# Patient Record
Sex: Female | Born: 1945 | Race: White | Hispanic: No | Smoking: Never smoker
Health system: Southern US, Community
[De-identification: ages and names within clinical notes are randomized; demographics above are authoritative.]

## PROBLEM LIST (undated history)

## (undated) DIAGNOSIS — R42 Dizziness and giddiness: Secondary | ICD-10-CM

## (undated) DIAGNOSIS — D688 Other specified coagulation defects: Secondary | ICD-10-CM

## (undated) DIAGNOSIS — E785 Hyperlipidemia, unspecified: Secondary | ICD-10-CM

## (undated) DIAGNOSIS — R000 Tachycardia, unspecified: Secondary | ICD-10-CM

## (undated) DIAGNOSIS — K589 Irritable bowel syndrome without diarrhea: Secondary | ICD-10-CM

## (undated) DIAGNOSIS — R002 Palpitations: Secondary | ICD-10-CM

## (undated) DIAGNOSIS — R7303 Prediabetes: Secondary | ICD-10-CM

## (undated) DIAGNOSIS — F32A Depression, unspecified: Secondary | ICD-10-CM

## (undated) DIAGNOSIS — K5792 Diverticulitis of intestine, part unspecified, without perforation or abscess without bleeding: Secondary | ICD-10-CM

## (undated) DIAGNOSIS — I4719 Other supraventricular tachycardia: Secondary | ICD-10-CM

## (undated) DIAGNOSIS — I471 Supraventricular tachycardia: Secondary | ICD-10-CM

## (undated) DIAGNOSIS — F329 Major depressive disorder, single episode, unspecified: Secondary | ICD-10-CM

## (undated) DIAGNOSIS — M1990 Unspecified osteoarthritis, unspecified site: Secondary | ICD-10-CM

## (undated) HISTORY — PX: TONSILLECTOMY: SUR1361

## (undated) HISTORY — DX: Supraventricular tachycardia: I47.1

## (undated) HISTORY — PX: CATARACT EXTRACTION: SUR2

## (undated) HISTORY — PX: ANTERIOR AND POSTERIOR REPAIR: SHX1172

## (undated) HISTORY — DX: Irritable bowel syndrome, unspecified: K58.9

## (undated) HISTORY — DX: Palpitations: R00.2

## (undated) HISTORY — DX: Dizziness and giddiness: R42

## (undated) HISTORY — DX: Tachycardia, unspecified: R00.0

## (undated) HISTORY — DX: Hyperlipidemia, unspecified: E78.5

## (undated) HISTORY — PX: TUBAL LIGATION: SHX77

## (undated) HISTORY — DX: Other supraventricular tachycardia: I47.19

---
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
## 1983-06-17 HISTORY — PX: TOTAL ABDOMINAL HYSTERECTOMY: SHX209

## 1998-12-12 ENCOUNTER — Other Ambulatory Visit: Admission: RE | Admit: 1998-12-12 | Discharge: 1998-12-12 | Payer: Self-pay | Admitting: Obstetrics & Gynecology

## 2000-01-09 ENCOUNTER — Other Ambulatory Visit: Admission: RE | Admit: 2000-01-09 | Discharge: 2000-01-09 | Payer: Self-pay | Admitting: Obstetrics & Gynecology

## 2000-12-29 ENCOUNTER — Other Ambulatory Visit: Admission: RE | Admit: 2000-12-29 | Discharge: 2000-12-29 | Payer: Self-pay | Admitting: Obstetrics & Gynecology

## 2001-09-24 ENCOUNTER — Inpatient Hospital Stay (HOSPITAL_COMMUNITY): Admission: RE | Admit: 2001-09-24 | Discharge: 2001-09-25 | Payer: Self-pay | Admitting: Obstetrics & Gynecology

## 2001-10-19 ENCOUNTER — Inpatient Hospital Stay (HOSPITAL_COMMUNITY): Admission: AD | Admit: 2001-10-19 | Discharge: 2001-10-19 | Payer: Self-pay | Admitting: Obstetrics and Gynecology

## 2002-02-02 ENCOUNTER — Other Ambulatory Visit: Admission: RE | Admit: 2002-02-02 | Discharge: 2002-02-02 | Payer: Self-pay | Admitting: Obstetrics & Gynecology

## 2003-02-09 ENCOUNTER — Other Ambulatory Visit: Admission: RE | Admit: 2003-02-09 | Discharge: 2003-02-09 | Payer: Self-pay | Admitting: Obstetrics & Gynecology

## 2004-02-21 ENCOUNTER — Other Ambulatory Visit: Admission: RE | Admit: 2004-02-21 | Discharge: 2004-02-21 | Payer: Self-pay | Admitting: Obstetrics & Gynecology

## 2004-06-16 HISTORY — PX: LAPAROSCOPIC CHOLECYSTECTOMY: SUR755

## 2005-03-26 ENCOUNTER — Other Ambulatory Visit: Admission: RE | Admit: 2005-03-26 | Discharge: 2005-03-26 | Payer: Self-pay | Admitting: Obstetrics & Gynecology

## 2005-04-16 ENCOUNTER — Encounter (INDEPENDENT_AMBULATORY_CARE_PROVIDER_SITE_OTHER): Payer: Self-pay | Admitting: Specialist

## 2005-04-16 ENCOUNTER — Inpatient Hospital Stay (HOSPITAL_COMMUNITY): Admission: RE | Admit: 2005-04-16 | Discharge: 2005-04-19 | Payer: Self-pay | Admitting: Surgery

## 2005-04-18 ENCOUNTER — Encounter (INDEPENDENT_AMBULATORY_CARE_PROVIDER_SITE_OTHER): Payer: Self-pay | Admitting: Specialist

## 2010-01-22 ENCOUNTER — Ambulatory Visit: Payer: Self-pay | Admitting: Cardiology

## 2010-01-30 ENCOUNTER — Ambulatory Visit (HOSPITAL_COMMUNITY): Admission: RE | Admit: 2010-01-30 | Discharge: 2010-01-30 | Payer: Self-pay | Admitting: Cardiology

## 2010-01-30 ENCOUNTER — Ambulatory Visit: Payer: Self-pay | Admitting: Cardiology

## 2010-06-20 ENCOUNTER — Ambulatory Visit: Payer: Self-pay | Admitting: Cardiology

## 2010-11-01 NOTE — Op Note (Signed)
[name], [name]                ACCOUNT NO.:  [id number]   MEDICAL RECORD NO.:  [id number]          PATIENT TYPE:  AMB   LOCATION:  DAY                          FACILITY:  [facility]   PHYSICIAN:  Thomas A. Cornett, M.D.DATE OF BIRTH:  September 22, 1945   DATE OF PROCEDURE:  04/16/2005  DATE OF DISCHARGE:                                 OPERATIVE REPORT   PREOPERATIVE DIAGNOSES:  Symptomatic cholelithiasis with elevation of liver  function studies.   POSTOPERATIVE DIAGNOSES:  1.  Symptomatic cholelithiasis with elevation of liver function studies.  2.  Roughly a 1 cm common duct stone.   PROCEDURE:  Laparoscopic cholecystectomy with intraoperative cholangiogram.   SURGEON:  Thomas A. Cornett, M.D.   ASSISTANT:  Sheppard Plumber. Earlene Plater, M.D.   ANESTHESIA:  General endotracheal anesthesia with 15 mL of 0.25% Sensorcaine  local.   ESTIMATED BLOOD LOSS:  20 mL.   DRAINS:  None.   SPECIMEN:  Gallbladder with sludge and debris.   INDICATIONS FOR PROCEDURE:  The patient is a 65 year old female whose had  intermittent right upper quadrant pain with nausea and vomiting. She was  noted to have sludge and what appeared to be some particulate gallstone  material in the gallbladder. Also her liver function were transiently  elevated. Given her presentation which was consistent symptomatic  cholelithiasis and elevation of LFTs, I recommend a laparoscopic  cholecystectomy with intraoperative cholangiogram for treatment. She  understood the risks of the procedure and agreed to proceed.   DESCRIPTION OF PROCEDURE:  The patient was brought to the operating suite  and placed supine. After induction of general endotracheal anesthesia, her  abdomen was prepped and draped in a sterile fashion. A 1 cm supraumbilical  incision was made and dissection was carried down to her fascia. There was  an umbilical hernia noted and I used this to enter her abdominal cavity.  Once I entered the abdominal cavity, a  pursestring suture of #0 Vicryl was  used around the defect. A 12-mm Hassan cannula was placed under direct  vision. Once the pursestring suture was placed, this was secured to the  cannula and CO2 was insufflated to 15 mmHg pressure of CO2. Laparoscopy was  then performed. There were some adhesions inferior to the incision where the  umbilical defect was with omentum but no evidence of any bowel up in this.  The remainder of her laparoscopy was within normal limits. Next, a 5 mm  subxiphoid port was placed under direct vision. Two of the 5 mm ports were  placed in the right upper quadrant under direct vision with local  anesthesia. The gallbladder dome was retracted toward the patient's right  shoulder and a second grasper was used to grab the gallbladder at the  infundibulum. This was retracted at the right lower quadrant. There were  some omental adhesions I gently pushed away and used electrocautery to  dissect away from the infundibulum of the gallbladder. I then dissected  around the cystic duct circumferentially. This was the only tubular  structure entering the gallbladder. I then placed clips on cystic duct side  of  the gallbladder and made a small ductotomy for intraoperative  cholangiogram. Three separate stab incision. Through a separate stab  incision, a Cook catheter was placed. The [facility] catheter was then placed into  the cystic duct and held in place with a clip. Intraoperative cholangiogram  revealed opacification of a long cystic duct entering the common duct. There  was contrast that went up to the bifurcation and down through the common  duct into the duodenum. There was a circular filling that despite numerous  images and putting the patient in both flat and then in Trendelenburg, I  could not get to move and did not appear to be an air bubble at this point.  My concern was for a small retained common duct stone. There was no evidence  of obstruction and there was free  flow of contrast into the duodenum. At  this point in time, I elected to complete the cholecystectomy and seek ERCP  postoperatively given the size of the stone and omental dilatation without  any obvious signs of obstruction. The cystic duct remnant was triple clipped  and divided. I then completed the division of the cystic duct at this point.  There is a small posterior cystic artery and I was able to dissect this out  and double clip and divide it. Cautery was used to dissect the gallbladder  from the gallbladder fossa but the gallbladder was entered with spillage of  bile and no obvious loss of large stones. There was some residual sludge in  this. Once I was able to get around the hole in the gallbladder, I dissected  the remainder of the gallbladder out of the gallbladder fossa. I then used a  5 mm scope and placed the gallbladder in an EndoCatch bag through the  umbilical port and extracted the specimen. There was significant ooze in the  liver bed and I had to use cautery and Surgicel to control this. The clips  were on the cystic duct and cystic artery with good hemostasis without  leakage of bile. At this point in time after using cautery to control the  majority of the hepatic bed bleeding, this irrigation was suctioned out.  There appeared to be good hemostasis to this point. The remainder of the  irrigation was suctioned out. As for her umbilical hernia, given the fact  that it was significantly large almost 2 to 2 1/2 cm and the fact that there  was spillage of bile during the procedure, I did not feel it would be safe  to repair this since this would probably require mesh and I felt this could  be done at a later time once her common duct stone has been addressed  especially in light of her getting instrumentation of her biliary tract. At  this point in time, I then went ahead and closed the umbilical port site  after removing all the ports and releasing the CO2 with the #0  Vicryl stitch. There is no evidence of any port site bleeding. I then closed all  skin incisions with 4-0 Monocryl. Steri-Strips and dry dressings were  applied. Sponge, needle and instrument counts were counted and found to be  correct at this portion of the case. The patient was awoke, taken to  recovery in satisfactory condition. Postoperatively, I will ask  gastroenterologist to see her for consideration of ERCP or sphincterotomy.      Thomas A. Cornett, M.D.  Electronically Signed     TAC/MEDQ  D:  04/16/2005  T:  04/16/2005  Job:  [id number]   cc:   Gabriel Earing, M.D.  Fax: [phone]   Langley Gauss, MD

## 2010-11-01 NOTE — Op Note (Signed)
[facility]  Patient:    CHERLY, ERNO Visit Number: [id number] MRN: [id number]          Service Type: DSU Location: 9300 [id number] Attending Physician:  Minette Headland Dictated by:   Freddy Finner, M.D. Proc. Date: 09/23/01 Admit Date:  09/23/2001                             Operative Report  PREOPERATIVE DIAGNOSES:       Symptomatic cystourethrocele, symptomatic rectocele.  POSTOPERATIVE DIAGNOSES:      Symptomatic cystourethrocele, symptomatic rectocele.  OPERATIVE PROCEDURE:          Anterior and posterior colporrhaphy.  SURGEON:                      Freddy Finner, M.D.  ASSISTANT:                    Marcelle Overlie, M.D.  ANESTHESIA:                   General.  ESTIMATED INTRAOPERATIVE BLOOD LOSS:                   100 cc.  INTRAOPERATIVE COMPLICATIONS: None.  Details of the present illness recorded in the admission note.  DESCRIPTION OF PROCEDURE:     The patient was admitted on the morning of surgery.  She was given a bolus of Cefotan IV.  She was placed in PAS hose. She was taken to the operating room, and there placed under adequate general endotracheal anesthesia, and placed in the dorsal lithotomy position using the [facility] stirrup system.  Betadine prep of the lower abdomen, perineum, and vagina was carried out in the usual fashion with scrub, followed by solution. Sterile drapes were applied.  Posterior weighted vaginal retractor was placed. Anterior vaginal mucosa was tented with an Allis and entered sharply.  An incision of approximately 5 cm length was created in the midline and with careful blunt and sharp dissection, the bladder and urethra were separated from the overlying vaginal mucosa and vesicovaginal fascia.  The vesicovaginal fascia was then plicated using a total of three interrupted 0 Monocryl sutures to elevate the urethrovesical angle and to reduce the cystocele.  The incision was then closed after  removing small segments of tissue with a running locking stitch of 2-0 Monocryl.  Posterior repair was then begun.  The posterior weighted retractor was removed.  The ______ was grasped on each side with Allis clamps, pyramidal shaped segment of skin was removed from the peroneal body with apex inferiorly.  The mucosa overlying the rectum was then developed with blunt and sharp dissection, and incision made in the posterior vaginal mucosa for a distance of approximately 5-7 cm.  The perirectal tissue was separated off of the mucosa.  Perirectal tissue was then plicated with 0 Monocryl sutures and levators were approximated to lengthen the vagina. Segments of the mucosa were then excised and then the posterior mucosa and perineal body closed with running 2-0 Monocryl.  The bladder was filled with irrigating solution, suprapubic banana catheter was placed without difficulty and anchored to the skin with nylon suture.  Two inch Iodoform gauze pack was placed.  The bladder was evacuated.  The patient was awakened and taken to recovery in good condition. Dictated by:   Freddy Finner, M.D. Attending Physician:  Minette Headland DD:  09/23/01 TD:  09/23/01 Job: [id number]

## 2010-11-01 NOTE — Consult Note (Signed)
[name], [name]                ACCOUNT NO.:  [id number]   MEDICAL RECORD NO.:  [id number]          PATIENT TYPE:  AMB   LOCATION:  DAY                          FACILITY:  [facility]   PHYSICIAN:  James L. Malon Kindle., M.D.DATE OF BIRTH:  20-Oct-1945   DATE OF CONSULTATION:  04/16/2005  DATE OF DISCHARGE:                                   CONSULTATION   REASON FOR CONSULTATION:  The patient just underwent lap-chole, has retained  common duct stone.   HISTORY OF PRESENT ILLNESS:  This is a 65 year old woman who is a patient of  Dr. Fayrene Fearing Goreni's down in [facility].  She was found to have gallstones and  underwent a lap-chole today with the finding of a common duct stone.   OUTPATIENT MEDICATIONS:  1.  Ogen.  2.  Paxil.  3.  Allegra.  4.  Calcium.  5.  Vitamins.   ALLERGIES:  CODEINE, DEMERAL, CAUSE HER TO BE NAUSEATED.   PAST MEDICAL HISTORY:  1.  History of spastic colon.  2.  Hepatitis in 1989.  Hepatitis B and C serology is negative at this time.   PAST SURGICAL HISTORY:  1.  Tonsillectomy.  2.  Hysterectomy with A&P repair.  3.  Cystocele and rectocele repair.  4.  Cataract surgery.   SOCIAL HISTORY:  She is married, lives with her husband.   PHYSICAL EXAMINATION:  GENERAL APPEARANCE:  The patient is in the recovery  room and is quite sedated.  Nonicteric.  Alert and responsive but somnolent.  VITAL SIGNS:  Normal.  LUNGS:  Clear.  HEART:  Regular rate and rhythm without murmurs or gallops.  ABDOMEN:  Silent following her lap-chole.   ASSESSMENT:  Probable common duct stone.  Agree the ERCP and the  sphincterotomy would be appropriate at this time.   PLAN:  The patient was scheduled for an ERCP and probable sphincterotomy  tomorrow at approximately 11 o'clock by Dr. Danise Edge.  I have  discussed this procedure including the risks and benefits of the procedure  in detail with the patient's husband and daughter.           ______________________________  Llana Aliment. Malon Kindle., M.D.     Waldron Session  D:  04/16/2005  T:  04/16/2005  Job:  [id number]   cc:   Danise Edge, M.D.  Fax: [phone]   Leone Brand, M.D.  [facility]   Gabriel Earing, M.D.  Fax: [phone]

## 2010-11-01 NOTE — Discharge Summary (Signed)
[name], [name]                ACCOUNT NO.:  [id number]   MEDICAL RECORD NO.:  [id number]          PATIENT TYPE:  INP   LOCATION:  1613                         FACILITY:  [facility]   PHYSICIAN:  Maisie Fus A. Cornett, M.D.DATE OF BIRTH:  [birth year]   DATE OF ADMISSION:  04/16/2005  DATE OF DISCHARGE:  04/19/2005                                 DISCHARGE SUMMARY   ADMITTING DIAGNOSIS:  Cholelithiasis with elevated liver function studies.   DISCHARGE DIAGNOSIS:  Cholelithiasis and probable common bile duct stone.   PROCEDURE:  1.  Laparoscopic cholecystectomy and intraoperative cholangiogram.  2.  ERCP x2 with sphincterotomy.   BRIEF HISTORY:  The patient is a 65 year old female found to have  cholelithiasis, elevated liver function studies and dilated common duct. She  was admitted to the hospital for laparoscopic cholecystectomy which was done  April 16, 2005.   POSTOPERATIVE COURSE:  The patient's postop course was complicated by the  findings of a filling defect on her intraoperative cholangiogram. GI was  consulted and initial ERCP was done, but they were unable to cannulate the  common duct while reading the report. A second ERCP was then performed. They  were able to cannulate her common duct and felt they were able to slide a  balloon through the common duct and then drag out some debris which was seen  in the common duct. Sphincterotomy was also performed. This was done on  postop day #1 and postop day #2. By postoperative day #3 which was April 19, 2005 she was tolerating her diet, doing well and was discharged home with  normal liver function studies. She had no abdominal pain postoperatively and  had no ill effects from any of her procedures. She was discharged home on a  regular diet.   DISCHARGE INSTRUCTIONS:  The patient was discharged home on Percocet for  pain. She resumed her preoperative medications as before.   CONDITION ON DISCHARGE:  Satisfactory.   FOLLOW UP:  Will be in 2-3 weeks.      Thomas A. Cornett, M.D.  Electronically Signed     TAC/MEDQ  D:  05/05/2005  T:  05/06/2005  Job:  [id number]

## 2010-11-01 NOTE — Discharge Summary (Signed)
[facility]  Patient:    [name], [name] Visit Number: [id number] MRN: [id number]          Service Type: DSU Location: 9300 [id number] Attending Physician:  Minette Headland Dictated by:   Freddy Finner, M.D. Admit Date:  09/23/2001 Disc. Date: 09/25/01                             Discharge Summary  DISCHARGE DIAGNOSES:          1. Cystourethrocele.                               2. Stress incontinence.                               3. Symptomatic rectocele.  PROCEDURES:                   Anterior and posterior colporrhaphy.  COMPLICATIONS:                None.  DISPOSITION:                  The patient was in satisfactory and good condition at the time of her discharge.  At the present time, she is voiding small amounts with fairly small residuals.  She was given instructions to call when residual is 50 cc or less and voiding volumes are 200 cc or more.  She was given Darvocet to be taken for pain.  She is take a regular diet.  She is to use fiber supplements and stool softeners.  She is to take an over-the-counter vitamin.  Details of Present Illness, Past History, Family History, Review of Systems, and Physical Examination are recorded in the admission note.  The patients physical findings on admission were remarkable for cystocele and rectocele.  LABORATORY DATA:              On admission, normal CBC with hemoglobin 13.5; postoperative hemoglobin was 11.4.  Admission pro thrombin time, PTT, and INR were normal  HOSPITAL COURSE:              The patient was admitted on the morning of surgery.  She was placed in PAS hose.  She was given a bolus of Cefotan IV. She was taken to the operating room where the above described procedure was accomplished.  There were no intraoperative or postoperative complications. By the morning of the second postoperative day, she was in satisfactory condition.  She was ambulating without difficulty and  tolerating a regular diet.  She was having adequate bowel and bladder function and was discharged home with disposition as noted above.Dictated by:   Freddy Finner, M.D.  Attending Physician:  Minette Headland DD:  09/25/01 TD:  09/26/01 Job: [id number]

## 2010-11-01 NOTE — Op Note (Signed)
[name], [name]                ACCOUNT NO.:  [id number]   MEDICAL RECORD NO.:  [id number]          PATIENT TYPE:  INP   LOCATION:  1613                         FACILITY:  [facility]   PHYSICIAN:  Petra Kuba, M.D.    DATE OF BIRTH:  03-18-1946   DATE OF PROCEDURE:  04/18/2005  DATE OF DISCHARGE:                                 OPERATIVE REPORT   PROCEDURE:  Endoscopic retrograde cholangiopancreatography, sphincterotomy,  balloon pull-through and biopsy.   ENDOSCOPIST:  Petra Kuba, M.D.   INDICATIONS:  CBD stone on an intraoperative cholangiogram.  Consent was  signed after risks, benefits, methods, options were thoroughly discussed by  both Dr. Randa Evens, Laural Benes and myself over the last two days prior to having  premedication given.   MEDICATIONS USED:  Fentanyl 150 mcg, Versed 15 mg, glucagon 0.5.   DESCRIPTION OF PROCEDURE:  The side-viewing therapeutic video duodenoscope  was inserted by indirect vision into the stomach and advanced into the  duodenum.  This was slightly bulbous but essentially normal-appearing.  The  ampulla was brought into view.  One or two  folds below the ampulla. The  small possible polyp was seen.  It did not have any worrisome stigmata.  Initially, using the triple-lumen sphincterotome loaded with the 0.035 Jag  wire, we were able to get PD cannulation.  Once we realized we injected the  PD, we stopped the injection.  We then repositioned the sphincterotome on  multiple occasions and were unsuccessful at obtaining any CBD injections.  We did get unfortunately two submucosal injections.  We went ahead and  changed the regular sphincterotome for the tapered smaller sphincterotome  loaded with the 0.25 wire and unfortunately again obtained a few  pancreatograms.  We did not overfill the pancreas.  We did probe with the  wire and did insert the wire one time into the PD.  Once we did that, we  held the wire in place, removed the sphincterotome and went  ahead and tried  to cannulate using the regular sphincterotome and probing with the larger  wire and again were unsuccessful, gaining access.  We did move the patient  at this juncture to her left side.  The PD wire was removed and using the  smaller sphincterotome we were able to cannulate with the 0.825 wire.  We  went ahead and removed the smaller sphincterotome keeping the wire in the  proper position and then exchanged it for the larger sphincterotome.  A CBD  injection was then confirmed.  No stone was seen at this juncture.  We then  went ahead and proceeded with a medium size sphincterotomy until we had  adequate biliary drainage, and we can get the half bowed sphincterotome in  and out of the duct.  Based on the submucosal injection, we elected not to  cut any further since the landmarks were obscured.  We then exchanged for a  balloon and after one balloon pull-through which did not reveal any stone,  we proceeded with an occlusion cholangiogram, but there seemed to be a stone  just above where  we started the balloon pull-through so we went ahead and  dropped the balloon and inserted it further and then proceeded with two more  pull through without any stones being delivered.  There was only minimal  resistance in passing the 0.5 balloon through the sphincterotomy site  although we did pop one balloon on the first pull-through.  We then  proceeded with an occlusion cholangiogram at this juncture, which was normal  and on delayed views no stone was seen.  Excellent biliary drainage was seen  at this juncture.  We elected to stop this part of the procedure at this  time.  We went ahead and tried to biopsy the polyp because of the submucosal  injection, which did track distally.  The landmarks were distorted, but we  think we biopsied the polypoid area but could not be sure but did not see  any other area in question.  The scope was removed.  The patient tolerated  the procedure  adequately.  There was no obvious immediate complication.   ENDOSCOPIC DIAGNOSES:  1.  Normal or slightly bulbous ampulla.  2.  Submucosal injection.  3.  Normal pancreatic duct on a few injections  4.  I left the 0.25 wire in the pancreatic duct for a short time.  5.  Able to cannulate with the patient on her left side using the tapered      sphincterotome and the 0.825 wire.  6.  No stone seen initially but after one balloon pull-through possibly one      stone in the proximal duct, not delivered on two more balloon pull      through.  7.  Status post medium sphincterotomy.  8.  Three balloon pull through without stone being delivered.  9.  Negative occlusion cholangiogram.  10. Small duodenal polyp, questionably biopsied.   HPL  1.  Observe for delayed complications; if none but signs of retained CBD      stones happy to proceed with repeat ERCP which should be much easier now      that she has had a sphincterotomy.  2.  Also pending biopsy results; may need repeat EGD and rebiopsy based on      questionable adequate biopsies from the submucosal injection.  3.  One of my partners, I believe Dr. Evette Cristal, will round on her tomorrow.  4.  Will repeat labs and call her sooner p.r.n.           ______________________________  Petra Kuba, M.D.     MEM/MEDQ  D:  04/18/2005  T:  04/18/2005  Job:  [id number]   cc:   Petra Kuba, M.D.  Fax: [phone]   Gabriel Earing, M.D.  Fax: [phone]   Danise Edge, M.D.  Fax: [phone]   Clovis Pu. Cornett, M.D.  [address] [place] [address]  [place] [address]

## 2010-11-01 NOTE — Op Note (Signed)
[name], [name]                ACCOUNT NO.:  [id number]   MEDICAL RECORD NO.:  [id number]          PATIENT TYPE:  INP   LOCATION:  1613                         FACILITY:  [facility]   PHYSICIAN:  Danise Edge, M.D.   DATE OF BIRTH:  June 11, 1946   DATE OF PROCEDURE:  04/17/2005  DATE OF DISCHARGE:                                 OPERATIVE REPORT   PROCEDURE:  Unsuccessful common bile duct cannulation at ERCP.   PROCEDURE INDICATIONS:  [name] is a 65 year old female born 1946/04/25. [name] has undergone a laparoscopic cholecystectomy. Her  operative cholangiogram reveals a small common bile duct stone.   ENDOSCOPIST:  Danise Edge, M.D.   PREMEDICATION:  Rocephin 1 gram, glucagon 1 mg, fentanyl 75 mcg, Versed 7.5  mg.   DESCRIPTION OF PROCEDURE:  After obtaining informed consent, [name] was  placed in the prone position on the fluoroscopy table. I administered  intravenous fentanyl, intravenous Versed and intravenous glucagon. The  patient's blood pressure, oxygen saturation and cardiac rhythm were  monitored throughout the procedure and documented in the medical record.   The Olympus therapeutic duodenoscope was passed through the posterior  hypopharynx down the esophagus and into the proximal stomach without  difficulty. The pylorus was intubated and the endoscope advanced to the  second portion of the duodenum.   MAJOR PAPILLA:  The major papilla appears quite prominent but endoscopically  normal. Straw colored bile could be seen draining from the major papilla.   PANCREATOGRAM:  The distal pancreatic duct was cannulated. The distal  pancreatic duct was filled with contrast and appeared normal.   Attempted common bile duct cannulation using the wire loaded sphincterotome  was unsuccessful   A 3 mm polypoid type lesion was present just distal to the major papilla and  may actually represent an adenoma. I did not biopsy this lesion but  this area will  need to be biopsied with her repeat ERCP attempt.   ASSESSMENT:  1.  Unsuccessful common bile duct cannulation at ERCP.  2.  Normal distal pancreatic duct by pancreatogram.  3.  Small mucosal polypoid lesion just inferior to the major papilla.   RECOMMENDATIONS:  1.  Repeat ERCP attempt tomorrow by another endoscopist.  2.  Clear liquid diet today.  3.  Observe for signs of pancreatitis.  4.  Continue intravenous antibiotics.  5.  Intravenous saline at 200 mL per hour x 3000 mL and then reduce rate to      75 mL per hour.           ______________________________  Danise Edge, M.D.     MJ/MEDQ  D:  04/17/2005  T:  04/17/2005  Job:  [id number]

## 2010-11-01 NOTE — H&P (Signed)
[facility]  Patient:    [name], [name] Visit Number: [id number] MRN: [id number]          Service Type: DSU Location: [id number] Attending Physician:  Minette Headland Dictated by:   Freddy Finner, M.D. Admit Date:  09/23/2001                           History and Physical  ADMITTING DIAGNOSIS:          Symptomatic rectocele, symptomatic cystocele.  HISTORY OF PRESENT ILLNESS:   Patient is a 65 year old white married female, gravida 2, para 2, who presented approximately six months ago complaining of symptomatic rectocele.  She stated that this created some pressure and difficulty with bowel evacuation ad she was considering surgical repair.  She returned to the office again this year and requested surgical intervention. Her only complaint in January was of a rectocele.  But, on return to the office as part of her preoperative visit, she complained of pure stress incontinence.  On her examination, she does have a first-degree cystourethrocele.  Based on this finding, she is now admitted for anterior and posterior colporrhaphy.  PAST MEDICAL HISTORY:         Patient has no known significant medical illnesses.  ALLERGIES:                    She is allergic to CODEINE and DEMEROL.  CURRENT MEDICATIONS:          1. Ortho-Est 1.25 mg a day.                               2. Allegra 60 mg a day.                               3. Paxil 20 mg a day.  PREVIOUS SURGICAL PROCEDURES:                   Hysterectomy in 1985, dilatation and curettage in 1984, T&A at the age of 19, postpartum tubal after delivery in August 1980. She has had two vaginal deliveries.  She has never had a blood transfusion. She does not use cigarettes.  FAMILY HISTORY:               Noncontributory.  PHYSICAL EXAMINATION:  HEENT:                        Grossly within normal limits.  VITAL SIGNS:                  Blood pressure in the office is 136/74.  NECK:                          Thyroid gland is not palpably enlarged.  CHEST:                        Clear to auscultation.  HEART:                        Normal sinus rhythm without murmurs, rubs, or gallops.  ABDOMEN:  Soft and nontender without appreciable organomegaly or palpable masses.  The patient is moderately obese.  PELVIC:                       Exam is remarkable only for a first-degree cystourethrocele and for a second-degree rectocele.  Bimanual reveals no palpable masses.  The rectum is palpably normal.  EXTREMITIES:                  Without clubbing, cyanosis, or edema.  ASSESSMENT:                   Symptomatic pelvic relaxation with first-degree cystocele and second-degree rectocele.  PLAN:                         Anterior posterior colporrhaphy. Dictated by:   Freddy Finner, M.D. Attending Physician:  Minette Headland DD:  09/22/01 TD:  09/22/01 Job: [phone]

## 2011-11-28 ENCOUNTER — Encounter: Payer: Self-pay | Admitting: *Deleted

## 2012-10-08 ENCOUNTER — Other Ambulatory Visit: Payer: Self-pay | Admitting: Gastroenterology

## 2013-12-08 ENCOUNTER — Other Ambulatory Visit: Payer: Self-pay | Admitting: Dermatology

## 2016-03-08 ENCOUNTER — Emergency Department (HOSPITAL_BASED_OUTPATIENT_CLINIC_OR_DEPARTMENT_OTHER): Payer: Medicare Other

## 2016-03-08 ENCOUNTER — Observation Stay (HOSPITAL_BASED_OUTPATIENT_CLINIC_OR_DEPARTMENT_OTHER)
Admission: EM | Admit: 2016-03-08 | Discharge: 2016-03-09 | Disposition: A | Discharge status: Home / Self Care | Payer: Medicare Other | Attending: Family Medicine | Admitting: Family Medicine

## 2016-03-08 ENCOUNTER — Encounter (HOSPITAL_BASED_OUTPATIENT_CLINIC_OR_DEPARTMENT_OTHER): Payer: Self-pay | Admitting: Emergency Medicine

## 2016-03-08 DIAGNOSIS — K589 Irritable bowel syndrome without diarrhea: Secondary | ICD-10-CM | POA: Diagnosis not present

## 2016-03-08 DIAGNOSIS — R12 Heartburn: Secondary | ICD-10-CM | POA: Diagnosis not present

## 2016-03-08 DIAGNOSIS — E785 Hyperlipidemia, unspecified: Secondary | ICD-10-CM | POA: Insufficient documentation

## 2016-03-08 DIAGNOSIS — K5732 Diverticulitis of large intestine without perforation or abscess without bleeding: Secondary | ICD-10-CM | POA: Diagnosis present

## 2016-03-08 DIAGNOSIS — R072 Precordial pain: Principal | ICD-10-CM | POA: Insufficient documentation

## 2016-03-08 DIAGNOSIS — R079 Chest pain, unspecified: Secondary | ICD-10-CM

## 2016-03-08 DIAGNOSIS — R1013 Epigastric pain: Secondary | ICD-10-CM | POA: Diagnosis present

## 2016-03-08 DIAGNOSIS — R21 Rash and other nonspecific skin eruption: Secondary | ICD-10-CM | POA: Insufficient documentation

## 2016-03-08 HISTORY — DX: Diverticulitis of intestine, part unspecified, without perforation or abscess without bleeding: K57.92

## 2016-03-08 LAB — CBC WITH DIFFERENTIAL/PLATELET
BASOS ABS: 0 10*3/uL (ref 0.0–0.1)
Basophils Relative: 0 %
Eosinophils Absolute: 0.2 10*3/uL (ref 0.0–0.7)
Eosinophils Relative: 3 %
HCT: 42.9 % (ref 36.0–46.0)
Hemoglobin: 14.1 g/dL (ref 12.0–15.0)
Lymphocytes Relative: 22 %
Lymphs Abs: 1.9 10*3/uL (ref 0.7–4.0)
MCH: 26.9 pg (ref 26.0–34.0)
MCHC: 32.9 g/dL (ref 30.0–36.0)
MCV: 81.9 fL (ref 78.0–100.0)
MONO ABS: 0.7 10*3/uL (ref 0.1–1.0)
Monocytes Relative: 8 %
Neutro Abs: 5.7 10*3/uL (ref 1.7–7.7)
Neutrophils Relative %: 67 %
Platelets: 277 10*3/uL (ref 150–400)
RBC: 5.24 MIL/uL — ABNORMAL HIGH (ref 3.87–5.11)
RDW: 13.8 % (ref 11.5–15.5)
WBC: 8.5 10*3/uL (ref 4.0–10.5)

## 2016-03-08 LAB — COMPREHENSIVE METABOLIC PANEL
ALBUMIN: 4.2 g/dL (ref 3.5–5.0)
ALK PHOS: 62 U/L (ref 38–126)
ALT: 37 U/L (ref 14–54)
AST: 48 U/L — AB (ref 15–41)
Anion gap: 7 (ref 5–15)
BILIRUBIN TOTAL: 0.6 mg/dL (ref 0.3–1.2)
BUN: 12 mg/dL (ref 6–20)
CO2: 27 mmol/L (ref 22–32)
Calcium: 9.4 mg/dL (ref 8.9–10.3)
Chloride: 106 mmol/L (ref 101–111)
Creatinine, Ser: 0.76 mg/dL (ref 0.44–1.00)
GFR calc Af Amer: >60 mL/min (ref >60)
GFR calc non Af Amer: >60 mL/min (ref >60)
GLUCOSE: 120 mg/dL — AB (ref 65–99)
POTASSIUM: 3.6 mmol/L (ref 3.5–5.1)
Sodium: 140 mmol/L (ref 135–145)
TOTAL PROTEIN: 7.3 g/dL (ref 6.5–8.1)

## 2016-03-08 LAB — TROPONIN I
Troponin I: <0.03 ng/mL (ref <0.03)
Troponin I: <0.03 ng/mL (ref <0.03)

## 2016-03-08 LAB — LIPASE, BLOOD: Lipase: 30 U/L (ref 11–51)

## 2016-03-08 LAB — URINALYSIS, ROUTINE W REFLEX MICROSCOPIC
Bilirubin Urine: NEGATIVE
Glucose, UA: NEGATIVE mg/dL
HGB URINE DIPSTICK: NEGATIVE
Ketones, ur: NEGATIVE mg/dL
LEUKOCYTES UA: NEGATIVE
Nitrite: NEGATIVE
Protein, ur: NEGATIVE mg/dL
Specific Gravity, Urine: 1.007 (ref 1.005–1.030)
pH: 6 (ref 5.0–8.0)

## 2016-03-08 MED ORDER — ONDANSETRON HCL 4 MG/2ML IJ SOLN: 4.0000 mg | Freq: Four times a day (QID) | INTRAMUSCULAR | Status: DC | PRN

## 2016-03-08 MED ORDER — CALCIUM 600 + MINERALS 600-200 MG-UNIT PO TABS: 1.0000 | ORAL_TABLET | Freq: Two times a day (BID) | ORAL | Status: DC

## 2016-03-08 MED ORDER — VITAMIN D 1000 UNITS PO TABS: 2000.0000 [IU] | ORAL_TABLET | Freq: Every day | ORAL | Status: DC

## 2016-03-08 MED ORDER — GI COCKTAIL ~~LOC~~: 30.0000 mL | Freq: Four times a day (QID) | ORAL | Status: DC | PRN

## 2016-03-08 MED ORDER — CALCIUM CARBONATE-VITAMIN D 500-200 MG-UNIT PO TABS: 1.0000 | ORAL_TABLET | Freq: Two times a day (BID) | ORAL | Status: DC

## 2016-03-08 MED ORDER — PRAVASTATIN SODIUM 40 MG PO TABS: 40.0000 mg | ORAL_TABLET | Freq: Every day | ORAL | Status: DC

## 2016-03-08 MED ORDER — ACETAMINOPHEN 325 MG PO TABS: 650.0000 mg | ORAL_TABLET | ORAL | Status: DC | PRN

## 2016-03-08 MED ORDER — FAMOTIDINE 20 MG PO TABS
20.0000 mg | ORAL_TABLET | Freq: Once | ORAL | Status: AC
  Administered 2016-03-08: 20 mg via ORAL
  Filled 2016-03-08: qty 1

## 2016-03-08 MED ORDER — ASPIRIN EC 81 MG PO TBEC
81.0000 mg | DELAYED_RELEASE_TABLET | Freq: Every day | ORAL | Status: DC
  Administered 2016-03-08: 81 mg via ORAL
  Filled 2016-03-08: qty 1

## 2016-03-08 MED ORDER — MAGNESIUM OXIDE 400 (241.3 MG) MG PO TABS: 200.0000 mg | ORAL_TABLET | Freq: Every day | ORAL | Status: DC

## 2016-03-08 MED ORDER — DIPHENHYDRAMINE HCL 25 MG PO CAPS
25.0000 mg | ORAL_CAPSULE | Freq: Once | ORAL | Status: AC
  Administered 2016-03-08: 25 mg via ORAL
  Filled 2016-03-08: qty 1

## 2016-03-08 MED ORDER — ENOXAPARIN SODIUM 40 MG/0.4ML ~~LOC~~ SOLN
40.0000 mg | SUBCUTANEOUS | Status: DC
  Administered 2016-03-08: 40 mg via SUBCUTANEOUS
  Filled 2016-03-08: qty 0.4

## 2016-03-08 MED ORDER — PAROXETINE HCL 20 MG PO TABS
20.0000 mg | ORAL_TABLET | Freq: Every day | ORAL | Status: DC
Start: 2016-03-09 — End: 2016-03-09
  Administered 2016-03-09: 20 mg via ORAL
  Filled 2016-03-08: qty 1

## 2016-03-08 MED ORDER — VITAMIN C 500 MG PO TABS: 500.0000 mg | ORAL_TABLET | Freq: Every day | ORAL | Status: DC

## 2016-03-08 MED ORDER — MAGNESIUM CITRATE 100 MG PO TABS: 100.0000 mg | ORAL_TABLET | Freq: Every day | ORAL | Status: DC

## 2016-03-08 MED ORDER — BIOTIN 5 MG PO CAPS: 5.0000 mg | ORAL_CAPSULE | Freq: Every day | ORAL | Status: DC

## 2016-03-08 MED ORDER — GI COCKTAIL ~~LOC~~
30.0000 mL | Freq: Once | ORAL | Status: AC
  Administered 2016-03-08: 30 mL via ORAL
  Filled 2016-03-08: qty 30

## 2016-03-08 MED ORDER — HYDROXYZINE HCL 10 MG PO TABS
10.0000 mg | ORAL_TABLET | ORAL | Status: DC | PRN
  Administered 2016-03-08: 10 mg via ORAL
  Filled 2016-03-08: qty 1

## 2016-03-08 NOTE — ED Provider Notes (Signed)
[facility] Provider Note   CSN: [id number] Arrival date & time: 03/08/16  4098     History   Chief Complaint Chief Complaint  Patient presents with  . Heartburn    HPI [name] is a 70 y.o. female.  HPI [name] is a 70 y.o. female with PMH significant for IBS, diverticulitis, palpitations who presents with intermittent, last less than 10 minutes per episode, mild-moderate epigastric and lower sternal pain that occasionally radiates to right jaw over the past week that became worse this morning at 6 AM.  She states she has been taking Cipro and Flagyl for diverticulitis flare over the past week when her symptoms started.  She states she took her pill at 10:30 PM last night and then woke up with the indigestion.  She has not taken anything for her symptoms.  Not associated with eating.  She describes it as burning.  Denies pain now.  No aggravating factors.  Denies CP, SOB, fever, vomiting, bloody stools.  She felt nauseous earlier, but this resolved.  This has never happened before.  No cardiac history.  She states she had a stress test 10 years ago that was normal.  No family history CAD <65. Secondarily, she complains of resolving pruritic rash she noticed this morning in her groin and under arms.  She said they appeared like whelps, but they have since gone away, aside from her right underarm.  She did not take anything for her symptoms.  Denies facial, lip, or throat swelling.  Denies any new soaps, detergents, lotions.  She has taken Cipro and Flagyl in the past without problems.   Past Medical History:  Diagnosis Date  . Diverticulitis   . IBS (irritable bowel syndrome)   . Other and unspecified hyperlipidemia   . Palpitations   . Vertigo     Patient Active Problem List   Diagnosis Date Noted  . Chest pain 03/08/2016    Past Surgical History:  Procedure Laterality Date  . CATARACT EXTRACTION  1998, 2003   bilateral  . LAPAROSCOPIC CHOLECYSTECTOMY   2006  . TOTAL ABDOMINAL HYSTERECTOMY  1985    OB History    No data available       Home Medications    Prior to Admission medications   Medication Sig Start Date End Date Taking? Authorizing Provider  aspirin EC 81 MG tablet Take 81 mg by mouth daily.   Yes Historical Provider, MD  Biotin (BIOTIN 5000) 5 MG CAPS Take 5 mg by mouth daily.   Yes Historical Provider, MD  Calcium Carbonate-Vit D-Min (CALCIUM 600 + MINERALS) 600-200 MG-UNIT TABS Take 1 tablet by mouth 2 (two) times daily.   Yes Historical Provider, MD  cholecalciferol (VITAMIN D) 1000 units tablet Take 2,000 Units by mouth daily.   Yes Historical Provider, MD  Magnesium Citrate 100 MG TABS Take 100 mg by mouth daily.   Yes Historical Provider, MD  magnesium citrate SOLN Take 1 Bottle by mouth once.   Yes Historical Provider, MD  omega-3 acid ethyl esters (LOVAZA) 1 g capsule Take by mouth 2 (two) times daily.   Yes Historical Provider, MD  vitamin C (ASCORBIC ACID) 500 MG tablet Take 500 mg by mouth daily.   Yes Historical Provider, MD  estradiol (VIVELLE-DOT) 0.025 MG/24HR Place 1 patch onto the skin 2 (two) times a week.    Historical Provider, MD  PARoxetine (PAXIL) 20 MG tablet Take 20 mg by mouth every morning.  Historical Provider, MD  pravastatin (PRAVACHOL) 40 MG tablet Take 40 mg by mouth daily.    Historical Provider, MD    Family History Family History  Problem Relation Age of Onset  . Heart disease Mother   . Uterine cancer Mother   . Diabetes Mother   . Hypertension Father   . Prostate cancer Father   . Hypertension Sister     Social History Social History  Substance Use Topics  . Smoking status: Never Smoker  . Smokeless tobacco: Never Used  . Alcohol use No     Allergies   Codeine; Demerol [meperidine]; and Robitussin dm [dextromethorphan-guaifenesin]   Review of Systems Review of Systems All other systems negative unless otherwise stated in HPI     Physical Exam Updated Vital  Signs BP 152/73   Pulse 85   Temp 97.9 F (36.6 C) (Oral)   Resp 16   Ht 5\' 2"  (1.575 m)   Wt 78 kg   SpO2 98%   BMI 31.46 kg/m   Physical Exam  Constitutional: She is oriented to person, place, and time. She appears well-developed and well-nourished.  Non-toxic appearance. She does not have a sickly appearance. She does not appear ill.  HENT:  Head: Normocephalic and atraumatic.  Mouth/Throat: Oropharynx is clear and moist.  No OP swelling.   Eyes: Conjunctivae are normal. Pupils are equal, round, and reactive to light.  Neck: Normal range of motion. Neck supple.  Cardiovascular: Normal rate and regular rhythm.   Pulmonary/Chest: Effort normal and breath sounds normal. No accessory muscle usage or stridor. No respiratory distress. She has no wheezes. She has no rhonchi. She has no rales.  Abdominal: Soft. Bowel sounds are normal. She exhibits no distension. There is no tenderness.  Musculoskeletal: Normal range of motion.  Lymphadenopathy:    She has no cervical adenopathy.  Neurological: She is alert and oriented to person, place, and time.  Speech clear without dysarthria.  Skin: Skin is warm and dry.  Raised wheal to right axilla without induration or fluctuance.    Psychiatric: She has a normal mood and affect. Her behavior is normal.     ED Treatments / Results  Labs (all labs ordered are listed, but only abnormal results are displayed) Labs Reviewed  COMPREHENSIVE METABOLIC PANEL - Abnormal; Notable for the following:       Result Value   Glucose, Bld 120 (*)    AST 48 (*)    All other components within normal limits  CBC WITH DIFFERENTIAL/PLATELET - Abnormal; Notable for the following:    RBC 5.24 (*)    All other components within normal limits  LIPASE, BLOOD  URINALYSIS, ROUTINE W REFLEX MICROSCOPIC (NOT AT [facility])  TROPONIN I  TROPONIN I    EKG  EKG Interpretation  Date/Time:  Saturday March 08 2016 10:35:11 EDT Ventricular Rate:  81 PR  Interval:    QRS Duration: 87 QT Interval:  386 QTC Calculation: 448 R Axis:   42 Text Interpretation:  Sinus rhythm Probable left atrial enlargement Low voltage, precordial leads Baseline wander in lead(s) V6 agree. no acute ischemia, no change from previous Confirmed by Donnald GarrePfeiffer, MD, Lebron ConnersMarcy [phone]([id number]) on 03/08/2016 11:09:03 AM       Radiology Dg Chest 2 View  Result Date: 03/08/2016 CLINICAL DATA:  Chest pain EXAM: CHEST  2 VIEW COMPARISON:  04/14/2005 FINDINGS: Lungs are clear.  No pleural effusion or pneumothorax. The heart is normal in size. Degenerative changes of the visualized thoracolumbar spine. IMPRESSION:  No evidence of acute cardiopulmonary disease. Electronically Signed   By: Charline Bills M.D.   On: 03/08/2016 12:51    Procedures Procedures (including critical care time)  Medications Ordered in ED Medications  diphenhydrAMINE (BENADRYL) capsule 25 mg (25 mg Oral Given 03/08/16 1038)  gi cocktail (Maalox,Lidocaine,Donnatal) (30 mLs Oral Given 03/08/16 1121)  famotidine (PEPCID) tablet 20 mg (20 mg Oral Given 03/08/16 1120)     Initial Impression / Assessment and Plan / ED Course  I have reviewed the triage vital signs and the nursing notes.  Pertinent labs & imaging results that were available during my care of the patient were reviewed by me and considered in my medical decision making (see chart for details).  Clinical Course  Comment By Time  Patient experienced heartburn symptoms.  She states they lasted 4 seconds.  Repeat EKG performed, NSR without ischemic changes. Ordered GI cocktail and pepcid.  Cheri Fowler, PA-C 09/23 1100   Patient presents with epigastric and lower substernal pain intermittently over the last week.  Episodes last less than 10 minutes and are not associated with eating.  Never happened before.  No recent cardiac work up.  EKG without ischemic changes.  Initial troponin normal.  CXR normal.  HEART score 4 (concerning story and age), she is high  risk and I believe patient would benefit from admission for ACS rule out.    Case has been discussed with and seen by Dr. Donnald Garre who agrees with the above plan for admission.   Final Clinical Impressions(s) / ED Diagnoses   Final diagnoses:  Chest pain, unspecified chest pain type    New Prescriptions New Prescriptions   No medications on file     Cheri Fowler, PA-C 03/08/16 1415    Arby Barrette, MD 03/08/16 1549

## 2016-03-08 NOTE — ED Notes (Signed)
Patient is resting comfortably. Family at bedside. No needs voiced at this time.

## 2016-03-08 NOTE — ED Notes (Signed)
Pt ambulatory to [facility]

## 2016-03-08 NOTE — Plan of Care (Signed)
Problem: Pain Managment: Goal: General experience of comfort will improve Outcome: Progressing Assess pain Routine and PRN

## 2016-03-08 NOTE — Progress Notes (Signed)
PHARMACIST - PHYSICIAN ORDER COMMUNICATION  CONCERNING: P&T Medication Policy on Herbal Medications  DESCRIPTION:  This patient's order for:  Biotin  has been noted.  This product(s) is classified as an "herbal" or natural product. Due to a lack of definitive safety studies or FDA approval, nonstandard manufacturing practices, plus the potential risk of unknown drug-drug interactions while on inpatient medications, the Pharmacy and Therapeutics Committee does not permit the use of "herbal" or natural products of this type within [facility].   ACTION TAKEN: The pharmacy department is unable to verify this order at this time and your patient has been informed of this safety policy. Please reevaluate patient's clinical condition at discharge and address if the herbal or natural product(s) should be resumed at that time.  Christoper Fabianaron Arling Cerone, PharmD, BCPS Clinical pharmacist, pager [phone] 03/08/2016 10:06 PM

## 2016-03-08 NOTE — H&P (Signed)
History and Physical    [name] ZOX:[id number] DOB: 07/23/1945 DOA: 03/08/2016  PCP: Pcp Not In System  Outpatient Specialists: none Patient coming from: home  Chief Complaint: chest pain  HPI: Nicole DubonnetLinda K Rojas is a 70 y.o. female with medical history significant of IBS, palpitations but otherwise healthy, presents to the emergency room with chief complaint of chest discomfort. Patient has noticed burning epigastric pain, coming on and off over the last 3-4 days. It is usually relieved in less than 10 minutes, and she usually takes an antiacid and that helps. Today she experienced "indigestion-type" epigastric discomfort, however she noticed that it is radiated to the right jaw as well as beneath the right scapula and her back. She denies any palpitations with this, she denies any diaphoresis, she denies any shortness of breath. She has mild abdominal pain, she was diagnosed last week with diverticulitis for which she has been on ciprofloxacin and metronidazole, last dose of ciprofloxacin was this morning at 8 AM (she actually did not take the last pill). She is otherwise healthy, she is able to do all her ADLs without any chest pain, shortness of breath, she considers as her pretty active however does not regularly exercise. She denies any prior cardiac history, she had a stress test about 10 years ago which was negative, otherwise no other cardiac testing. She does not smoke. She does not drink. She has no family history of early cardiac disease. She has hyperlipidemia for which she is supposed to be on statin, however last year in the winter she developed abdominal discomfort, nausea vomiting, thought to be due to the statin and it was discontinued since.  ED Course: in the ER blood work unremarkable, troponin negative, EKG non ischemic and CXR negative. TRH asked to admit   Review of Systems: As per HPI otherwise 10 point review of systems negative.   Past Medical History:  Diagnosis Date    . Diverticulitis   . IBS (irritable bowel syndrome)   . Other and unspecified hyperlipidemia   . Palpitations   . Vertigo     Past Surgical History:  Procedure Laterality Date  . CATARACT EXTRACTION  1998, 2003   bilateral  . LAPAROSCOPIC CHOLECYSTECTOMY  2006  . TOTAL ABDOMINAL HYSTERECTOMY  1985     reports that she has never smoked. She has never used smokeless tobacco. She reports that she does not drink alcohol or use drugs.  Allergies  Allergen Reactions  . Codeine   . Demerol [Meperidine]   . Robitussin Dm [Dextromethorphan-Guaifenesin]     Family History  Problem Relation Age of Onset  . Heart disease Mother   . Uterine cancer Mother   . Diabetes Mother   . Hypertension Father   . Prostate cancer Father   . Hypertension Sister     Prior to Admission medications   Medication Sig Start Date End Date Taking? Authorizing Provider  aspirin EC 81 MG tablet Take 81 mg by mouth daily.   Yes Historical Provider, MD  Biotin (BIOTIN 5000) 5 MG CAPS Take 5 mg by mouth daily.   Yes Historical Provider, MD  Calcium Carbonate-Vit D-Min (CALCIUM 600 + MINERALS) 600-200 MG-UNIT TABS Take 1 tablet by mouth 2 (two) times daily.   Yes Historical Provider, MD  cholecalciferol (VITAMIN D) 1000 units tablet Take 2,000 Units by mouth daily.   Yes Historical Provider, MD  Magnesium Citrate 100 MG TABS Take 100 mg by mouth daily.   Yes Historical Provider, MD  magnesium citrate SOLN Take 1 Bottle by mouth once.   Yes Historical Provider, MD  omega-3 acid ethyl esters (LOVAZA) 1 g capsule Take by mouth 2 (two) times daily.   Yes Historical Provider, MD  vitamin C (ASCORBIC ACID) 500 MG tablet Take 500 mg by mouth daily.   Yes Historical Provider, MD  estradiol (VIVELLE-DOT) 0.025 MG/24HR Place 1 patch onto the skin 2 (two) times a week.    Historical Provider, MD  PARoxetine (PAXIL) 20 MG tablet Take 20 mg by mouth every morning.    Historical Provider, MD  pravastatin (PRAVACHOL) 40 MG  tablet Take 40 mg by mouth daily.    Historical Provider, MD    Physical Exam: Vitals:   03/08/16 0937 03/08/16 1122 03/08/16 1323 03/08/16 1507  BP:  143/77 152/73 155/71  Pulse:  82 85 81  Resp:  16 16 16   Temp:      TempSrc:      SpO2:  96% 98% 97%  Weight: 78 kg (172 lb)     Height: 5\' 2"  (1.575 m)         Constitutional: NAD, calm, comfortable Vitals:   03/08/16 0937 03/08/16 1122 03/08/16 1323 03/08/16 1507  BP:  143/77 152/73 155/71  Pulse:  82 85 81  Resp:  16 16 16   Temp:      TempSrc:      SpO2:  96% 98% 97%  Weight: 78 kg (172 lb)     Height: 5\' 2"  (1.575 m)      Eyes: PERRL, lids and conjunctivae normal ENMT: Mucous membranes are moist.  Neck: normal, supple Respiratory: clear to auscultation bilaterally, no wheezing, no crackles. Normal respiratory effort. No accessory muscle use.  Cardiovascular: Regular rate and rhythm, no murmurs / rubs / gallops. No extremity edema. 2+ pedal pulses.  Abdomen: no tenderness, Bowel sounds positive.  Musculoskeletal: no clubbing / cyanosis. Skin: no rashes, lesions, ulcers. No induration Neurologic: non focal  Psychiatric: Normal judgment and insight. Alert and oriented x 3. Normal mood.   Labs on Admission: I have personally reviewed following labs and imaging studies  CBC:  Recent Labs Lab 03/08/16 1015  WBC 8.5  NEUTROABS 5.7  HGB 14.1  HCT 42.9  MCV 81.9  PLT 277   Basic Metabolic Panel:  Recent Labs Lab 03/08/16 1015  NA 140  K 3.6  CL 106  CO2 27  GLUCOSE 120*  BUN 12  CREATININE 0.76  CALCIUM 9.4   GFR: Estimated Creatinine Clearance: 61.5 mL/min (by C-G formula based on SCr of 0.76 mg/dL). Liver Function Tests:  Recent Labs Lab 03/08/16 1015  AST 48*  ALT 37  ALKPHOS 62  BILITOT 0.6  PROT 7.3  ALBUMIN 4.2    Recent Labs Lab 03/08/16 1015  LIPASE 30   No results for input(s): AMMONIA in the last 168 hours. Coagulation Profile: No results for input(s): INR, PROTIME in the  last 168 hours. Cardiac Enzymes:  Recent Labs Lab 03/08/16 1015 03/08/16 1420  TROPONINI <0.03 <0.03   BNP (last 3 results) No results for input(s): PROBNP in the last 8760 hours. HbA1C: No results for input(s): HGBA1C in the last 72 hours. CBG: No results for input(s): GLUCAP in the last 168 hours. Lipid Profile: No results for input(s): CHOL, HDL, LDLCALC, TRIG, CHOLHDL, LDLDIRECT in the last 72 hours. Thyroid Function Tests: No results for input(s): TSH, T4TOTAL, FREET4, T3FREE, THYROIDAB in the last 72 hours. Anemia Panel: No results for input(s): VITAMINB12, FOLATE, FERRITIN, TIBC,  IRON, RETICCTPCT in the last 72 hours. Urine analysis:    Component Value Date/Time   COLORURINE YELLOW 03/08/2016 1020   APPEARANCEUR CLEAR 03/08/2016 1020   LABSPEC 1.007 03/08/2016 1020   PHURINE 6.0 03/08/2016 1020   GLUCOSEU NEGATIVE 03/08/2016 1020   HGBUR NEGATIVE 03/08/2016 1020   BILIRUBINUR NEGATIVE 03/08/2016 1020   KETONESUR NEGATIVE 03/08/2016 1020   PROTEINUR NEGATIVE 03/08/2016 1020   NITRITE NEGATIVE 03/08/2016 1020   LEUKOCYTESUR NEGATIVE 03/08/2016 1020   Sepsis Labs: @LABRCNTIP (procalcitonin:4,lacticidven:4) )No results found for this or any previous visit (from the past 240 hour(s)).   Radiological Exams on Admission: Dg Chest 2 View  Result Date: 03/08/2016 CLINICAL DATA:  Chest pain EXAM: CHEST  2 VIEW COMPARISON:  04/14/2005 FINDINGS: Lungs are clear.  No pleural effusion or pneumothorax. The heart is normal in size. Degenerative changes of the visualized thoracolumbar spine. IMPRESSION: No evidence of acute cardiopulmonary disease. Electronically Signed   By: Charline Bills M.D.   On: 03/08/2016 12:51    EKG: Independently reviewed. Sinus rhythm  Assessment/Plan Active Problems:   Chest pain   Chest pain - With atypical features, suggesting GI component as well, her pains did appear a few days into her antibiotic treatment for diverticulitis, and GI  discomfort is a known side effect of virtually all antibiotics. Will keep on telemetry overnight, cycle cardiac enzymes and plan for a 2-D echo in the morning. Should be able to go home if everything is normal. Consult cardiology if significant chest pain overnight, abnormal echo or abnormal cardiac enzymes - Continue aspirin  Recent diverticulitis - Finished treatment with antibiotics today, improved, her abdominal discomfort has almost resolved  Hyperlipidemia - I will resume her statin and monitor for side effects   DVT prophylaxis: Lovenox  Code Status: Full  Family Communication: no family bedside Disposition Plan: admit to telemetry Consults called: none  Admission status: observation    Pamella Pert, MD [facility] Pager [phone]  If 7PM-7AM, please contact night-coverage [email or web address] Password [facility]  03/08/2016, 5:34 PM

## 2016-03-08 NOTE — ED Triage Notes (Signed)
Pt reports she was treated for suspected diverticulitis last week by PCP. Abx almost complete (one dose left). She is here today for a rash in groin area and "hearburn" on and off x 1 wk. No pain or discomfort at present.

## 2016-03-09 ENCOUNTER — Observation Stay (HOSPITAL_BASED_OUTPATIENT_CLINIC_OR_DEPARTMENT_OTHER): Payer: Medicare Other

## 2016-03-09 ENCOUNTER — Other Ambulatory Visit: Payer: Self-pay | Admitting: Student

## 2016-03-09 DIAGNOSIS — R079 Chest pain, unspecified: Secondary | ICD-10-CM | POA: Diagnosis not present

## 2016-03-09 DIAGNOSIS — R0609 Other forms of dyspnea: Secondary | ICD-10-CM

## 2016-03-09 LAB — ECHOCARDIOGRAM COMPLETE
Height: 62 in
Weight: 2708.8 oz

## 2016-03-09 LAB — TROPONIN I

## 2016-03-09 MED ORDER — PANTOPRAZOLE SODIUM 40 MG PO TBEC: 40.0000 mg | DELAYED_RELEASE_TABLET | Freq: Every day | ORAL | 0 refills | Status: DC

## 2016-03-09 NOTE — Consult Note (Signed)
Cardiology Consult    Patient ID: [name] MRN: [id number], DOB/AGE: [birth year]   Admit date: 03/08/2016 Date of Consult: 03/09/2016  Primary Physician: Pcp Not In System Reason for Consult: Chest Pain Primary Cardiologist: New - Dr. Mayford Knife Requesting Provider: Dr. Jerral Ralph   History of Present Illness    [name] is a 70 y.o. female with past medical history of IBS and HLD who presented to [facility] ED on 03/08/2016 for epigastric pain.  Patient reports she has suffered from diverticulosis in the past and was diagnosed with diverticulitis last week, being treated with Cipro and Flagyl. She has continued to experience suprapubic pain but says it has improved. Over the past 3 days, she has experienced epigastric pain. Says it occurs multiple times per day and lasts 10-20 minutes at a time. No association with rest or with exertion. No association with food. Has taken anti-acids without relief. Yesterday, the epigastric pain represented but radiated into her right shoulder and right jaw, which prompted her to seek medical attention.   She is active at baseline, helping take care of her two grandchildren. Performs household chores and goes shopping multiple times per week. Denies any recent chest discomfort or dyspnea with exertion with these activities.   She denies any prior cardiac history. Underwent a stress test 10+ years ago which she says was "normal". No history of HTN or Type 2 DM. Was diagnosed with HLD in the past but was taken off of this due to her cholesterol normalizing.   Mother had a PPM but not history of CAD. No personal tobacco or alcohol use.    While admitted, labs have shown a WBC of 8.5, Hgb 14.1, and platelets 277. K+ 3.6. Creatinine 0.76. Cyclic troponin values have been negative. CXR with no evidence of acute cardiopulmonary disease. EKG shows NSR, HR 79, with isolated TWI in V1.    Past Medical History   Past Medical History:  Diagnosis Date  .  Diverticulitis   . IBS (irritable bowel syndrome)   . Other and unspecified hyperlipidemia   . Palpitations   . Vertigo     Past Surgical History:  Procedure Laterality Date  . CATARACT EXTRACTION  1998, 2003   bilateral  . LAPAROSCOPIC CHOLECYSTECTOMY  2006  . TOTAL ABDOMINAL HYSTERECTOMY  1985     Allergies  Allergies  Allergen Reactions  . Codeine Nausea And Vomiting  . Demerol [Meperidine] Itching and Rash  . Robitussin Dm [Dextromethorphan-Guaifenesin]     Inpatient Medications    . aspirin EC  81 mg Oral Daily  . calcium-vitamin D  1 tablet Oral BID  . cholecalciferol  2,000 Units Oral Daily  . enoxaparin (LOVENOX) injection  40 mg Subcutaneous Q24H  . magnesium oxide  200 mg Oral Daily  . PARoxetine  20 mg Oral Daily  . pravastatin  40 mg Oral Daily  . vitamin C  500 mg Oral Daily    Family History    Family History  Problem Relation Age of Onset  . Heart disease Mother   . Uterine cancer Mother   . Diabetes Mother   . Hypertension Father   . Prostate cancer Father   . Hypertension Sister     Social History    Social History   Social History  . Marital status: Married    Spouse name: N/A  . Number of children: N/A  . Years of education: N/A   Occupational History  . Not on file.  Social History Main Topics  . Smoking status: Never Smoker  . Smokeless tobacco: Never Used  . Alcohol use No  . Drug use: No  . Sexual activity: Not on file   Other Topics Concern  . Not on file   Social History Narrative  . No narrative on file     Review of Systems    General:  No chills, fever, night sweats or weight changes.  Cardiovascular:  No dyspnea on exertion, edema, orthopnea, palpitations, paroxysmal nocturnal dyspnea. Positive for chest pain.  Dermatological: No rash, lesions/masses Respiratory: No cough, dyspnea Urologic: No hematuria, dysuria Abdominal:   No nausea, vomiting, diarrhea, bright red blood per rectum, or hematemesis.  Positive for dark, tarry stools and abdominal pain.  Neurologic:  No visual changes, wkns, changes in mental status. All other systems reviewed and are otherwise negative except as noted above.  Physical Exam    Blood pressure 138/72, pulse 81, temperature 98.1 F (36.7 C), resp. rate 17, height 5\' 2"  (1.575 m), weight 169 lb 4.8 oz (76.8 kg), SpO2 97 %.  General: Pleasant, Caucasian female appearing in NAD. Psych: Normal affect. Neuro: Alert and oriented X 3. Moves all extremities spontaneously. HEENT: Normal  Neck: Supple without bruits or JVD. Lungs:  Resp regular and unlabored, CTA without wheezing or rales. Heart: RRR no s3, s4, or murmurs. Abdomen: Soft, non-distended, BS + x 4. Tender to palpation along umbilicus and suprapubic region. Extremities: No clubbing, cyanosis or edema. DP/PT/Radials 2+ and equal bilaterally.  Labs    Troponin (Point of Care Test) No results for input(s): TROPIPOC in the last 72 hours.  Recent Labs  03/08/16 1015 03/08/16 1420 03/08/16 1942 03/09/16 0141  TROPONINI <0.03 <0.03 <0.03 <0.03   Lab Results  Component Value Date   WBC 8.5 03/08/2016   HGB 14.1 03/08/2016   HCT 42.9 03/08/2016   MCV 81.9 03/08/2016   PLT 277 03/08/2016    Recent Labs Lab 03/08/16 1015  NA 140  K 3.6  CL 106  CO2 27  BUN 12  CREATININE 0.76  CALCIUM 9.4  PROT 7.3  BILITOT 0.6  ALKPHOS 62  ALT 37  AST 48*  GLUCOSE 120*   No results found for: CHOL, HDL, LDLCALC, TRIG No results found for: [facility]   Radiology Studies    Dg Chest 2 View  Result Date: 03/08/2016 CLINICAL DATA:  Chest pain EXAM: CHEST  2 VIEW COMPARISON:  04/14/2005 FINDINGS: Lungs are clear.  No pleural effusion or pneumothorax. The heart is normal in size. Degenerative changes of the visualized thoracolumbar spine. IMPRESSION: No evidence of acute cardiopulmonary disease. Electronically Signed   By: Charline Bills M.D.   On: 03/08/2016 12:51    EKG & Cardiac Imaging    EKG:  NSR, HR 79, with isolated TWI in V1.   Echocardiogram: None on File  Assessment & Plan   1. Chest Pain with Mixed Typical and Atypical Features - diagnosed with diverticulitis last week. Has been experiencing epigastric pain but this radiated to her right shoulder and right jaw yesterday. No association with food. Occurring at rest and with exertion. Pain can last 10-20 minutes at a time. No associated symptoms. No recent chest discomfort or dyspnea with exertion prior to her episode of diverticulitis.  - no prior cardiac history. No history of HTN or Type 2 DM.  - cyclic troponin values have been negative.  EKG shows NSR, HR 79, with isolated TWI in V1.  - would obtain echocardiogram to  assess LV function and wall motion. Overall, her symptoms seem most consistent with a GI etiology. If echo is normal, would not pursue further inpatient ischemic testing. She unfortunately consumed half of a Coke this morning, therefore a NST cannot be performed today. Could consider outpatient NST if echo is normal.   2. Diverticulitis - recently completed treatment for this with Cipro and Flagyl.  - still with some abdominal discomfort but significantly improved.  3. HLD - not currently on statin therapy. - followed by PCP.    Signed, Ellsworth LennoxBrittany M Torina Ey, PA-C 03/09/2016, 8:13 AM Pager: [phone]

## 2016-03-09 NOTE — Progress Notes (Signed)
  Echocardiogram 2D Echocardiogram has been performed.  Nicole SavoyCasey N Marion Rojas 03/09/2016, 10:15 AM

## 2016-03-09 NOTE — Discharge Summary (Signed)
PATIENT DETAILS Name: [name] Age: 70 y.o. Sex: female Date of Birth: January 27, 1946 MRN: [id number]. Admitting Physician: Levie Heritage, DO PCP:Pcp Not In System  Admit Date: 03/08/2016 Discharge date: 03/09/2016  Recommendations for Outpatient Follow-up:  1. Follow up with PCP in 1-2 weeks 2. Ensure follow up with cardiology   Admitted From:  Home  Disposition: Home   Home Health: No  Equipment/Devices: None  Discharge Condition: Stable  CODE STATUS: FULL CODE  Diet recommendation:  Heart Healthy  Brief Summary: See H&P, Labs, Consult and Test reports for all details in brief, patient was admitted for evaluation of chest pain  Brief Hospital Course: Chest Pain:with mostly atypical features-likely GERD. Cardiac enzymes negative, Echo with preserved EF and no wall motion abnormalities. Cardiology recommending outpatient stress test. Will d/c home on PPI  Recent Diverticulitis: completed course of Abx. Abd is soft and non tender  Rest of her medical problems were stable during this short hospital stay  Procedures/Studies: Echo  Discharge Diagnoses:  Active Problems:   Chest pain   Diverticulitis of colon   IBS (irritable bowel syndrome)   Pain in the chest   Discharge Instructions:  Activity:  As tolerated with Full fall precautions use walker/cane & assistance as needed  Discharge Instructions    Call MD for:  persistant nausea and vomiting    Complete by:  As directed    Call MD for:  severe uncontrolled pain    Complete by:  As directed    Diet - low sodium heart healthy    Complete by:  As directed    Increase activity slowly    Complete by:  As directed        Medication List    STOP taking these medications   ciprofloxacin 500 MG tablet Commonly known as:  CIPRO   metroNIDAZOLE 500 MG tablet Commonly known as:  FLAGYL     TAKE these medications   aspirin EC 81 MG tablet Take 81 mg by mouth daily.   B Complex-B12  Tabs Take 1 tablet by mouth daily.   BIOTIN 5000 5 MG Caps Generic drug:  Biotin Take 5 mg by mouth daily.   CALCIUM 600 + MINERALS 600-200 MG-UNIT Tabs Take 1 tablet by mouth 2 (two) times daily.   Vitamin D 2000 units Caps Take 2,000 Units by mouth daily.   cholecalciferol 1000 units tablet Commonly known as:  VITAMIN D Take 2,000 Units by mouth daily.   COQ10 PO Take 1 capsule by mouth daily.   hyoscyamine 0.125 MG SL tablet Commonly known as:  LEVSIN SL Take 0.125 mg by mouth every 4 (four) hours as needed for pain.   KRILL OIL PLUS Caps Take 1 capsule by mouth daily.   Magnesium Citrate 100 MG Tabs Take 100 mg by mouth daily.   magnesium citrate Soln Take 1 Bottle by mouth once.   MAGNESIUM PO Take 1 tablet by mouth daily.   omega-3 acid ethyl esters 1 g capsule Commonly known as:  LOVAZA Take by mouth 2 (two) times daily.   pantoprazole 40 MG tablet Commonly known as:  PROTONIX Take 1 tablet (40 mg total) by mouth daily.   PARoxetine 20 MG tablet Commonly known as:  PAXIL Take 10 mg by mouth every morning.   Potassium 99 MG Tabs Take 99 mg by mouth daily.   pravastatin 40 MG tablet Commonly known as:  PRAVACHOL Take 40 mg by mouth daily.   vitamin C 500 MG  tablet Commonly known as:  ASCORBIC ACID Take 500 mg by mouth daily.   VITAMIN E PO Take 1 capsule by mouth daily.      Follow-up Information    [facility]. Schedule an appointment as soon as possible for a visit in 2 week(s).   Specialty:  Cardiology Why:  follow up with cardiology for a stress test Contact information: [address], [address] [place] [address] [phone]         Allergies  Allergen Reactions  . Codeine Nausea And Vomiting  . Demerol [Meperidine] Itching and Rash  . Robitussin Dm [Dextromethorphan-Guaifenesin]       Consultations:   cardiology    Other Procedures/Studies: Dg Chest 2 View  Result Date:  03/08/2016 CLINICAL DATA:  Chest pain EXAM: CHEST  2 VIEW COMPARISON:  04/14/2005 FINDINGS: Lungs are clear.  No pleural effusion or pneumothorax. The heart is normal in size. Degenerative changes of the visualized thoracolumbar spine. IMPRESSION: No evidence of acute cardiopulmonary disease. Electronically Signed   By: Charline Bills M.D.   On: 03/08/2016 12:51     TODAY-DAY OF DISCHARGE:  Subjective:   [name] today has no headache,no chest abdominal pain,no new weakness tingling or numbness, feels much better wants to go home today.   Objective:   Blood pressure 138/72, pulse 81, temperature 98.1 F (36.7 C), resp. rate 17, height 5\' 2"  (1.575 m), weight 76.8 kg (169 lb 4.8 oz), SpO2 97 %.  Intake/Output Summary (Last 24 hours) at 03/09/16 1304 Last data filed at 03/09/16 0838  Gross per 24 hour  Intake              480 ml  Output                0 ml  Net              480 ml   Filed Weights   03/08/16 0937 03/08/16 1734 03/09/16 0625  Weight: 78 kg (172 lb) 76.9 kg (169 lb 8 oz) 76.8 kg (169 lb 4.8 oz)    Exam: Awake Alert, Oriented *3, No new F.N deficits, Normal affect [name],PERRAL Supple Neck,No JVD, No cervical lymphadenopathy appriciated.  Symmetrical Chest wall movement, Good air movement bilaterally, CTAB RRR,No Gallops,Rubs or new Murmurs, No Parasternal Heave +ve B.Sounds, Abd Soft, Non tender, No organomegaly appriciated, No rebound -guarding or rigidity. No Cyanosis, Clubbing or edema, No new Rash or bruise   PERTINENT RADIOLOGIC STUDIES: Dg Chest 2 View  Result Date: 03/08/2016 CLINICAL DATA:  Chest pain EXAM: CHEST  2 VIEW COMPARISON:  04/14/2005 FINDINGS: Lungs are clear.  No pleural effusion or pneumothorax. The heart is normal in size. Degenerative changes of the visualized thoracolumbar spine. IMPRESSION: No evidence of acute cardiopulmonary disease. Electronically Signed   By: Charline Bills M.D.   On: 03/08/2016 12:51     PERTINENT LAB  RESULTS: CBC:  Recent Labs  03/08/16 1015  WBC 8.5  HGB 14.1  HCT 42.9  PLT 277   CMET CMP     Component Value Date/Time   NA 140 03/08/2016 1015   K 3.6 03/08/2016 1015   CL 106 03/08/2016 1015   CO2 27 03/08/2016 1015   GLUCOSE 120 (H) 03/08/2016 1015   BUN 12 03/08/2016 1015   CREATININE 0.76 03/08/2016 1015   CALCIUM 9.4 03/08/2016 1015   PROT 7.3 03/08/2016 1015   ALBUMIN 4.2 03/08/2016 1015   AST 48 (H) 03/08/2016 1015  ALT 37 03/08/2016 1015   ALKPHOS 62 03/08/2016 1015   BILITOT 0.6 03/08/2016 1015   GFRNONAA >60 03/08/2016 1015   GFRAA >60 03/08/2016 1015    GFR Estimated Creatinine Clearance: 61 mL/min (by C-G formula based on SCr of 0.76 mg/dL).  Recent Labs  03/08/16 1015  LIPASE 30    Recent Labs  03/08/16 1942 03/09/16 0141 03/09/16 0803  TROPONINI <0.03 <0.03 <0.03   Invalid input(s): POCBNP No results for input(s): DDIMER in the last 72 hours. No results for input(s): HGBA1C in the last 72 hours. No results for input(s): CHOL, HDL, LDLCALC, TRIG, CHOLHDL, LDLDIRECT in the last 72 hours. No results for input(s): TSH, T4TOTAL, T3FREE, THYROIDAB in the last 72 hours.  Invalid input(s): FREET3 No results for input(s): VITAMINB12, FOLATE, FERRITIN, TIBC, IRON, RETICCTPCT in the last 72 hours. Coags: No results for input(s): INR in the last 72 hours.  Invalid input(s): PT Microbiology: No results found for this or any previous visit (from the past 240 hour(s)).  FURTHER DISCHARGE INSTRUCTIONS:  Get Medicines reviewed and adjusted: Please take all your medications with you for your next visit with your Primary MD  Laboratory/radiological data: Please request your Primary MD to go over all hospital tests and procedure/radiological results at the follow up, please ask your Primary MD to get all Hospital records sent to his/her office.  In some cases, they will be blood work, cultures and biopsy results pending at the time of your  discharge. Please request that your primary care M.D. goes through all the records of your hospital data and follows up on these results.  Also Note the following: If you experience worsening of your admission symptoms, develop shortness of breath, life threatening emergency, suicidal or homicidal thoughts you must seek medical attention immediately by calling 911 or calling your MD immediately  if symptoms less severe.  You must read complete instructions/literature along with all the possible adverse reactions/side effects for all the Medicines you take and that have been prescribed to you. Take any new Medicines after you have completely understood and accpet all the possible adverse reactions/side effects.   Do not drive when taking Pain medications or sleeping medications (Benzodaizepines)  Do not take more than prescribed Pain, Sleep and Anxiety Medications. It is not advisable to combine anxiety,sleep and pain medications without talking with your primary care practitioner  Special Instructions: If you have smoked or chewed Tobacco  in the last 2 yrs please stop smoking, stop any regular Alcohol  and or any Recreational drug use.  Wear Seat belts while driving.  Please note: You were cared for by a hospitalist during your hospital stay. Once you are discharged, your primary care physician will handle any further medical issues. Please note that NO REFILLS for any discharge medications will be authorized once you are discharged, as it is imperative that you return to your primary care physician (or establish a relationship with a primary care physician if you do not have one) for your post hospital discharge needs so that they can reassess your need for medications and monitor your lab values.  Total Time spent coordinating discharge including counseling, education and face to face time equals 25  minutes.  SignedJeoffrey Massed: GHIMIRE,SHANKER 03/09/2016 1:04 PM

## 2016-03-24 ENCOUNTER — Telehealth (HOSPITAL_COMMUNITY): Payer: Self-pay | Admitting: *Deleted

## 2016-03-24 NOTE — Telephone Encounter (Signed)
Patient given detailed instructions per Myocardial Perfusion Study Information Sheet for the test on 03/26/16. Patient notified to arrive 15 minutes early and that it is imperative to arrive on time for appointment to keep from having the test rescheduled.  If you need to cancel or reschedule your appointment, please call the office within 24 hours of your appointment. Failure to do so may result in a cancellation of your appointment, and a $50 no show fee. Patient verbalized understanding. Bani Gianfrancesco Jacqueline   

## 2016-03-24 NOTE — Telephone Encounter (Signed)
Left message on voicemail in reference to upcoming appointment scheduled for 03/26/16. Phone number given for a call back so details instructions can be given. [name], [name]

## 2016-03-26 ENCOUNTER — Ambulatory Visit (HOSPITAL_COMMUNITY): Payer: Medicare Other | Attending: Cardiovascular Disease

## 2016-03-26 DIAGNOSIS — R079 Chest pain, unspecified: Secondary | ICD-10-CM | POA: Insufficient documentation

## 2016-03-26 DIAGNOSIS — R0609 Other forms of dyspnea: Secondary | ICD-10-CM

## 2016-03-26 LAB — MYOCARDIAL PERFUSION IMAGING
CHL CUP NUCLEAR SSS: 11
CSEPPHR: 103 {beats}/min
LVDIAVOL: 43 mL (ref 46–106)
LVSYSVOL: 10 mL
RATE: 0.22
Rest HR: 66 {beats}/min
SDS: 6
SRS: 5
TID: 1.01

## 2016-03-26 MED ORDER — REGADENOSON 0.4 MG/5ML IV SOLN
0.4000 mg | Freq: Once | INTRAVENOUS | Status: AC
  Administered 2016-03-26: 0.4 mg via INTRAVENOUS

## 2016-03-26 MED ORDER — TECHNETIUM TC 99M TETROFOSMIN IV KIT
32.2000 | PACK | Freq: Once | INTRAVENOUS | Status: AC | PRN
  Administered 2016-03-26: 32.2 via INTRAVENOUS
  Filled 2016-03-26: qty 33

## 2016-03-26 MED ORDER — TECHNETIUM TC 99M TETROFOSMIN IV KIT
10.9000 | PACK | Freq: Once | INTRAVENOUS | Status: AC | PRN
  Administered 2016-03-26: 10.9 via INTRAVENOUS
  Filled 2016-03-26: qty 11

## 2017-10-30 IMAGING — CR DG CHEST 2V
2 series · 2 of 2 positions shown · non-contrast
Comparison: 04/14/2005

CLINICAL DATA: Chest pain

EXAM:
CHEST  2 VIEW

[w chest pa]
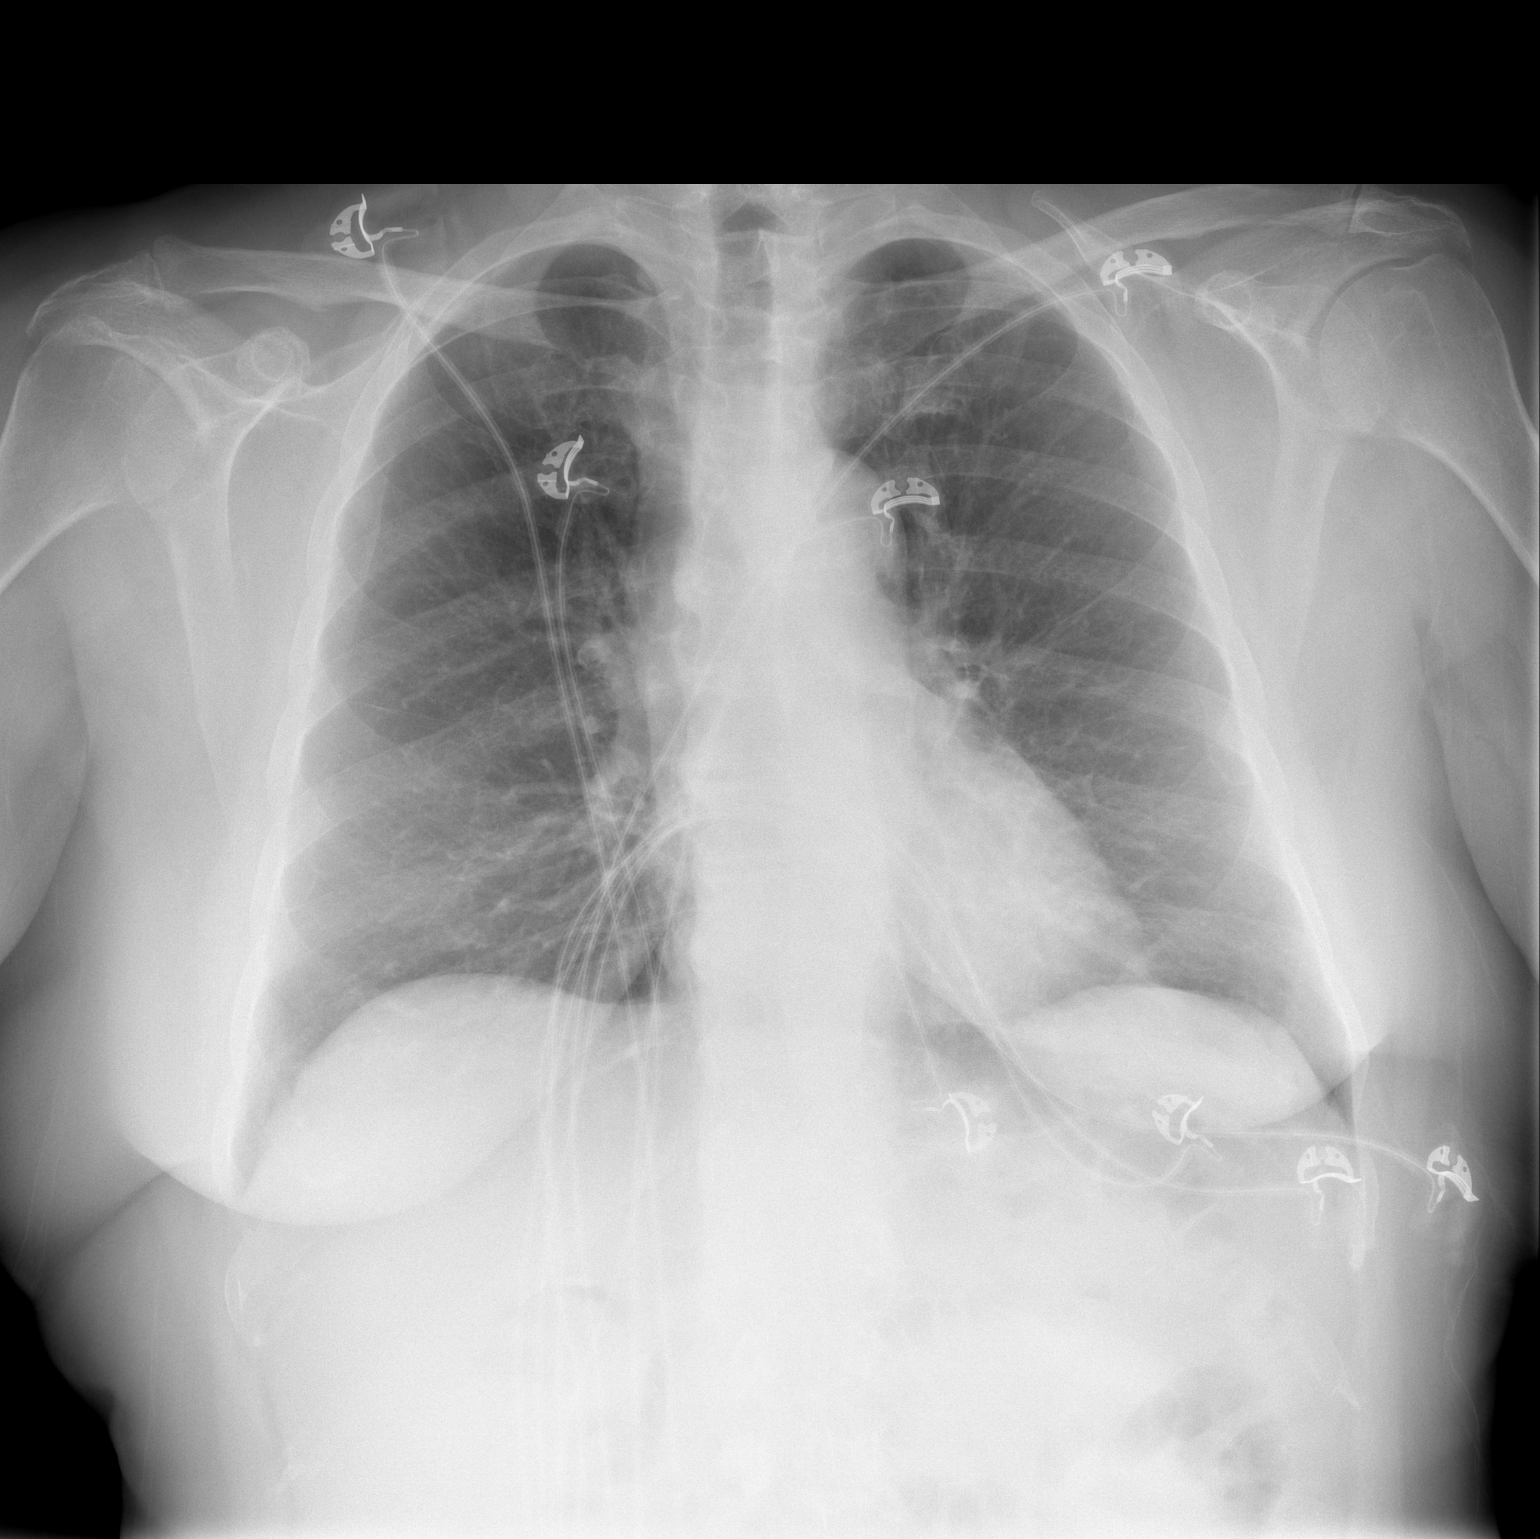

[w chest lat]
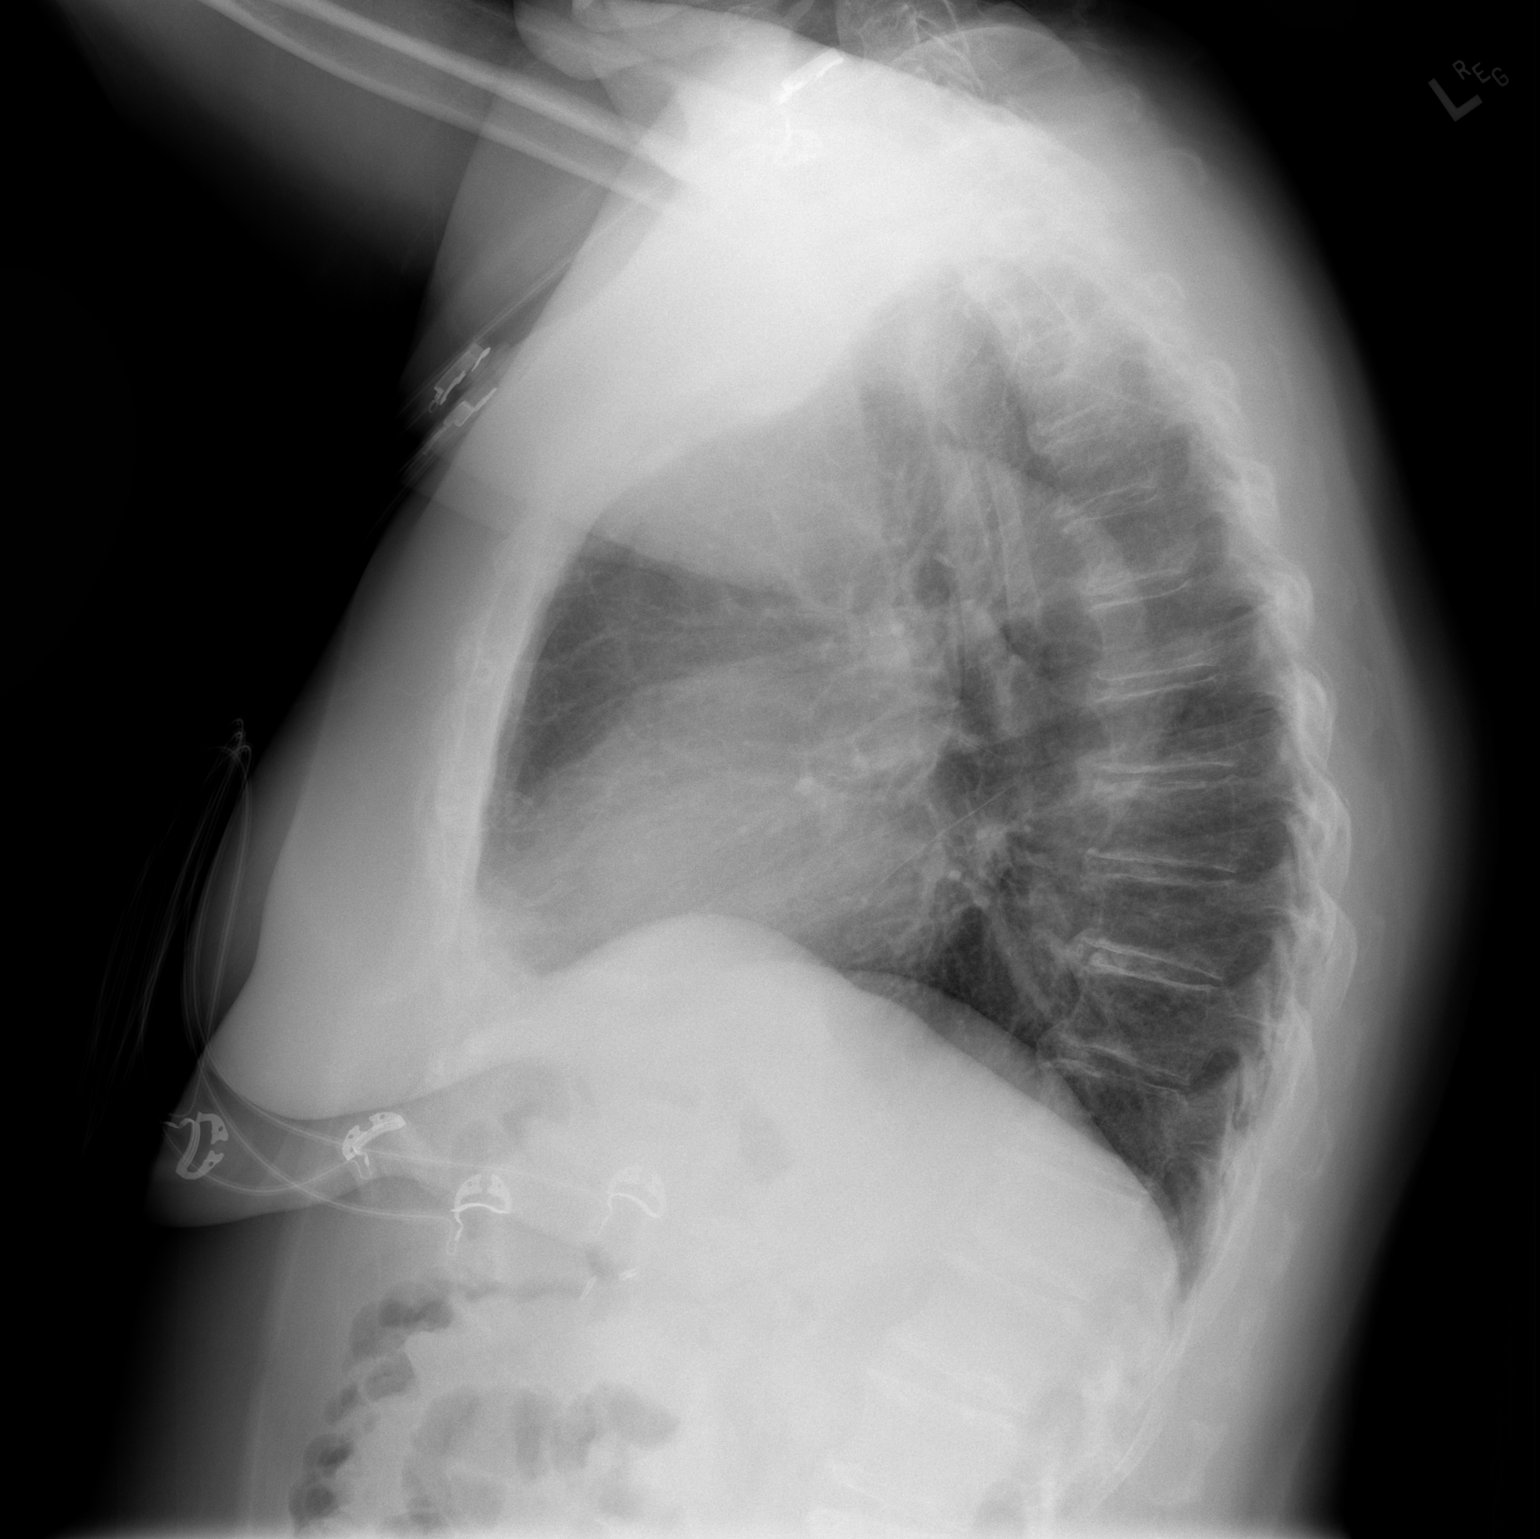

[2 of 2 positions shown; findings below may reference images not displayed]

FINDINGS: Lungs are clear.  No pleural effusion or pneumothorax.

The heart is normal in size.

Degenerative changes of the visualized thoracolumbar spine.
IMPRESSION: No evidence of acute cardiopulmonary disease.

## 2018-11-15 ENCOUNTER — Ambulatory Visit: Payer: Medicare Other | Admitting: Physician Assistant

## 2018-11-16 ENCOUNTER — Telehealth: Payer: Self-pay | Admitting: Radiology

## 2018-11-16 NOTE — Telephone Encounter (Signed)
New appointment made.

## 2018-11-16 NOTE — Telephone Encounter (Signed)
Patient left message on triage to make reschedule her canceled appointment with Rexene Edison, PA.  Attempted to call back phone rang twice then no sound.  CB # [phone]

## 2018-11-22 ENCOUNTER — Other Ambulatory Visit: Payer: Self-pay

## 2018-11-22 ENCOUNTER — Encounter: Payer: Self-pay | Admitting: Physician Assistant

## 2018-11-22 ENCOUNTER — Ambulatory Visit (INDEPENDENT_AMBULATORY_CARE_PROVIDER_SITE_OTHER): Payer: Medicare Other | Admitting: Physician Assistant

## 2018-11-22 ENCOUNTER — Ambulatory Visit: Payer: Medicare Other

## 2018-11-22 DIAGNOSIS — M79604 Pain in right leg: Secondary | ICD-10-CM | POA: Diagnosis not present

## 2018-11-22 MED ORDER — LIDOCAINE HCL 1 % IJ SOLN
3.0000 mL | INTRAMUSCULAR | Status: AC | PRN
  Administered 2018-11-22: 3 mL

## 2018-11-22 MED ORDER — METHYLPREDNISOLONE ACETATE 40 MG/ML IJ SUSP
40.0000 mg | INTRAMUSCULAR | Status: AC | PRN
  Administered 2018-11-22: 40 mg via INTRA_ARTICULAR

## 2018-11-22 NOTE — Progress Notes (Signed)
--   Office Visit Note   Patient: [name]           Date of Birth: 09/28/1945           MRN: [id number] Visit Date: 11/22/2018              Requested by: No referring provider defined for this encounter. PCP: System, Pcp Not In   Assessment & Plan: Visit Diagnoses:  1. Pain in right leg     Plan: She will follow-up with us in 2 weeks to see what type of results she had with the injection.  Also discussed with her quad strengthening exercises.  She is to be mindful of any catching locking giving way painful popping in the right knee.  Also mindful of how long or if she got any relief with the injection.  Questions were encouraged and answered.  Follow-Up Instructions: Return in about 2 weeks (around 12/06/2018).   Orders:  Orders Placed This Encounter  Procedures  . Large Joint Inj: R knee  . XR Tibia/Fibula Right  . XR Knee 1-2 Views Right   No orders of the defined types were placed in this encounter.     Procedures: Large Joint Inj: R knee on 11/22/2018 9:32 AM Indications: pain Details: 22 G 1.5 in needle, anterolateral approach  Arthrogram: No  Medications: 3 mL lidocaine 1 %; 40 mg methylPREDNISolone acetate 40 MG/ML Outcome: tolerated well, no immediate complications Procedure, treatment alternatives, risks and benefits explained, specific risks discussed. Consent was given by the patient. Immediately prior to procedure a time out was called to verify the correct patient, procedure, equipment, support staff and site/side marked as required. Patient was prepped and draped in the usual sterile fashion.       Clinical Data: No additional findings.   Subjective: Chief Complaint  Patient presents with  . Right Leg - Pain    HPI [name] is a 73 year old female who has not been seen in at least 5 years.  She comes in today with 8 weeks of right leg and knee pain.  She states she twisted the knee and has had pain in the knee since that time.  She  describes a burning-like sensation.  Knee sometimes feels as if it is going to give way.  Otherwise no mechanical symptoms.  She is tried a brace and some Tylenol PM.  She has good and bad days.  She has difficulty whenever she begins to ambulate after sitting for prolonged period of time.  She denies any numbness tingling down the leg.  She felt that she probably had a proximal tib-fib fracture i.e. stress fracture and has just been waiting on it to heal.  Review of Systems See HPI otherwise negative or noncontributory.  Objective: Vital Signs: There were no vitals taken for this visit.  Physical Exam Constitutional:      Appearance: She is not ill-appearing or diaphoretic.  Pulmonary:     Effort: Pulmonary effort is normal.  Neurological:     Mental Status: She is alert and oriented to person, place, and time.  Psychiatric:        Mood and Affect: Mood normal.        Behavior: Behavior normal.     Ortho Exam Bilateral knees good range of motion.  Tenderness along the lateral joint line of the right knee only.  No instability valgus varus stressing.  McMurray's positive on the right negative on the left.  Slight tenderness over  the proximal fibular area of the right knee.  Calf supple nontender.  No effusion abnormal warmth erythema or ecchymosis of either knee. Specialty Comments:  No specialty comments available.  Imaging: Xr Knee 1-2 Views Right  Result Date: 11/22/2018 Right knee 2 views: No acute fracture.  Knee joints well maintained.  Mild patellofemoral arthritic changes.  No bony abnormalities otherwise.  Xr Tibia/fibula Right  Result Date: 11/22/2018  Right tib-fib: No acute fracture.  No periosteal reactions.  Knee and hip joints are well-maintained.    PMFS History: Patient Active Problem List   Diagnosis Date Noted  . Pain in the chest   . Chest pain 03/08/2016  . Diverticulitis of colon 03/08/2016  . IBS (irritable bowel syndrome) 03/08/2016   Past Medical  History:  Diagnosis Date  . Diverticulitis   . IBS (irritable bowel syndrome)   . Other and unspecified hyperlipidemia   . Palpitations   . Vertigo     Family History  Problem Relation Age of Onset  . Heart disease Mother   . Uterine cancer Mother   . Diabetes Mother   . Hypertension Father   . Prostate cancer Father   . Hypertension Sister     Past Surgical History:  Procedure Laterality Date  . CATARACT EXTRACTION  1998, 2003   bilateral  . LAPAROSCOPIC CHOLECYSTECTOMY  2006  . TOTAL ABDOMINAL HYSTERECTOMY  1985   Social History   Occupational History  . Not on file  Tobacco Use  . Smoking status: Never Smoker  . Smokeless tobacco: Never Used  Substance and Sexual Activity  . Alcohol use: No  . Drug use: No  . Sexual activity: Not on file

## 2018-12-06 ENCOUNTER — Other Ambulatory Visit: Payer: Self-pay

## 2018-12-06 ENCOUNTER — Encounter: Payer: Self-pay | Admitting: Physician Assistant

## 2018-12-06 ENCOUNTER — Ambulatory Visit: Payer: Medicare Other | Admitting: Physician Assistant

## 2018-12-06 ENCOUNTER — Ambulatory Visit (INDEPENDENT_AMBULATORY_CARE_PROVIDER_SITE_OTHER): Payer: Medicare Other | Admitting: Physician Assistant

## 2018-12-06 DIAGNOSIS — G8929 Other chronic pain: Secondary | ICD-10-CM

## 2018-12-06 DIAGNOSIS — M25561 Pain in right knee: Secondary | ICD-10-CM

## 2018-12-06 NOTE — Progress Notes (Signed)
HPI: [name] returns today follow-up of her right knee.  Underwent a right knee injection on 11/22/2018.  States the injection helped some but she still having some giving way sensation in the knee.  She is having trouble going up and down stairs painful.  She tried the quad strengthening exercises but this caused her increased pain anterior lateral aspect of the knee.  Again radiographs of the knee showed overall well-maintained knee except for some mild patellofemoral changes.  Review of systems: Negative for fevers or chills.  Please see HPI otherwise negative or noncontributory.  Physical exam: Right knee full extension full flexion.  She has tenderness along lateral joint line.  McMurray's causes discomfort.  No instability valgus varus stressing.  No abnormal warmth erythema.  Impression: Right knee pain  Plan: Due to the fact the patient continues to have giving way sensation in the knee and is having difficulty going up and down stairs like conservative treatment which occluded time: Cortisone injection and quad exercises.  And also due to the fact that she has only mild patellofemoral changes on plain radiographs recommend MRI to rule out meniscal tear as source of her knee pain.  She will follow-up after the MRI to go over the results and discuss further treatment.

## 2018-12-08 ENCOUNTER — Telehealth: Payer: Self-pay | Admitting: Physician Assistant

## 2018-12-08 NOTE — Telephone Encounter (Signed)
Patient called advised [facility] can't see her until 12/31/2018. Patient asked if she can be scheduled at another location sooner. Patient said she is scheduled to see Artis Delay 12/20/2018. The number to contact patient is [phone]

## 2018-12-09 NOTE — Telephone Encounter (Signed)
Sending patient to [facility]. 12/13/18 11am checkin 11:30am scan  [address] [place]  P: [phone] F: [phone]

## 2018-12-10 NOTE — Addendum Note (Signed)
Addended by: Michae Kava B on: 12/10/2018 08:10 AM   Modules accepted: Orders

## 2018-12-10 NOTE — Telephone Encounter (Signed)
Faxed MRI order to [facility]: Mon. 12/13/18 arrival time 11am No auth req  Phone number for facility [phone]

## 2018-12-20 ENCOUNTER — Ambulatory Visit (INDEPENDENT_AMBULATORY_CARE_PROVIDER_SITE_OTHER): Payer: Medicare Other | Admitting: Physician Assistant

## 2018-12-20 ENCOUNTER — Encounter: Payer: Self-pay | Admitting: Physician Assistant

## 2018-12-20 ENCOUNTER — Other Ambulatory Visit: Payer: Self-pay

## 2018-12-20 DIAGNOSIS — M23200 Derangement of unspecified lateral meniscus due to old tear or injury, right knee: Secondary | ICD-10-CM | POA: Diagnosis not present

## 2018-12-20 DIAGNOSIS — M23202 Derangement of unspecified lateral meniscus due to old tear or injury, unspecified knee: Secondary | ICD-10-CM

## 2018-12-20 NOTE — Progress Notes (Signed)
Office Visit Note   Patient: Nicole DubonnetLinda K Rojas           Date of Birth: 03/24/1946           MRN: [id number] Visit Date: 12/20/2018              Requested by: No referring provider defined for this encounter. PCP: System, Pcp Not In   Assessment & Plan: Visit Diagnoses:  1. Other old tear of lateral meniscus of knee, unspecified laterality     Plan:  Due to patient's MRI findings, continued mechanical symptoms of knee giving way and pain recommend knee arthroscopy.  This would consist of knee arthroscopy with debridement and partial lateral meniscectomy right knee.  Risk benefits discussed with patient at length.  Due to her family history of DVT PE recommend Xarelto postop for DVT prophylaxis.  Follow-Up Instructions: Return for 1 week post op .   Orders:  No orders of the defined types were placed in this encounter.  No orders of the defined types were placed in this encounter.     Procedures: No procedures performed   Clinical Data: No additional findings.   Subjective: No chief complaint on file.   HPI Mrs. [name] returns today follow-up of her right knee status post MRI.  She had [facility] images unavailable she did not bring a disc.  She did bring a  copy of the report.  She continues to have pain and giving way of the right knee. Patient does state that her daughter and sister both had a clotting disorder she was tested for this and was also shown to have the same clotting disorder however she is never had a PE or DVT.  Daughter at age 75 had PE and is on chronic anticoagulation.  MRI was read as a the right knee having a lateral meniscal tear at the junction of the anterior horn and body standing to the superior articular surface fraying and apparent small tears along the free edge of the posterior horn.  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  Small to moderate knee effusion and mild plica  formation also noted.  Review of Systems Denies any fevers chills.  Objective: Vital Signs: There were no vitals taken for this visit.  Physical Exam General: Well-developed well-nourished female no acute distress Ortho Exam Right knee: No abnormal warmth erythema small effusion.  Tenderness along the medial lateral joint lines.  Positive McMurray's.  No instability valgus varus stressing.  Good range of motion knee otherwise. Specialty Comments:  No specialty comments available.  Imaging: No results found.   PMFS History: Patient Active Problem List   Diagnosis Date Noted   Old tear of lateral meniscus of knee 12/20/2018   Pain in the chest    Chest pain 03/08/2016   Diverticulitis of colon 03/08/2016   IBS (irritable bowel syndrome) 03/08/2016   Past Medical History:  Diagnosis Date   Diverticulitis    IBS (irritable bowel syndrome)    Other and unspecified hyperlipidemia    Palpitations    Vertigo     Family History  Problem Relation Age of Onset   Heart disease Mother    Uterine cancer Mother    Diabetes Mother    Hypertension Father    Prostate cancer Father    Hypertension Sister     Past Surgical History:  Procedure Laterality Date   CATARACT EXTRACTION  1998, 2003  bilateral   LAPAROSCOPIC CHOLECYSTECTOMY  2006   TOTAL ABDOMINAL HYSTERECTOMY  1985   Social History   Occupational History   Not on file  Tobacco Use   Smoking status: Never Smoker   Smokeless tobacco: Never Used  Substance and Sexual Activity   Alcohol use: No   Drug use: No   Sexual activity: Not on file

## 2018-12-31 ENCOUNTER — Other Ambulatory Visit: Payer: Medicare Other

## 2019-01-03 ENCOUNTER — Other Ambulatory Visit (HOSPITAL_COMMUNITY)
Admission: RE | Admit: 2019-01-03 | Discharge: 2019-01-03 | Disposition: A | Discharge status: Home / Self Care | Payer: Medicare Other | Source: Ambulatory Visit | Attending: Orthopaedic Surgery | Admitting: Orthopaedic Surgery

## 2019-01-03 ENCOUNTER — Other Ambulatory Visit: Payer: Self-pay | Admitting: Physician Assistant

## 2019-01-03 DIAGNOSIS — Z1159 Encounter for screening for other viral diseases: Secondary | ICD-10-CM | POA: Insufficient documentation

## 2019-01-04 ENCOUNTER — Other Ambulatory Visit: Payer: Self-pay

## 2019-01-04 ENCOUNTER — Encounter (HOSPITAL_COMMUNITY): Payer: Self-pay | Admitting: *Deleted

## 2019-01-04 LAB — SARS CORONAVIRUS 2 (TAT 6-24 HRS): SARS Coronavirus 2: NEGATIVE

## 2019-01-04 NOTE — Progress Notes (Addendum)
Spoke with pt for pre-op call. Pt states she has palpitations at times. Saw Dr. Doreatha Lew years ago. Pt states she has never been diagnosed with HTN, but is Pre-diabetic. Pt does check her sugar and it is usually around 108. Pt also has a hx of Factor II clotting deficiency. States she has never had a blood clot.  PCP is Jillyn Ledger, FNP.  Pt had Covid 19 test done yesterday. It was negative. Pt states she understands how to quarantine and is doing so.   Coronavirus Screening  Have you experienced the following symptoms:  Cough NO Fever (>100.70F)  NO Runny nose NO Sore throat NO Difficulty breathing/shortness of breath  NO  Have you or a family member traveled in the last 14 days and where? NO     Patient reminded that hospital visitation restrictions are in effect and the importance of the restrictions. Pt informed that her daughter can come into the hospital and wait in the waiting room, that she is not able to go to the pre-op with patient.

## 2019-01-05 ENCOUNTER — Encounter (HOSPITAL_COMMUNITY): Payer: Self-pay | Admitting: Vascular Surgery

## 2019-01-05 NOTE — Progress Notes (Addendum)
Anesthesia Chart Review:  Case: [id number] Date/Time: 01/06/19 0915   Procedure: RIGHT KNEE ARTHROSCOPY WITH DEBRIDEMENT AND PARTIAL LATERAL MENISCECTOMY (Right )   Anesthesia type: General   Pre-op diagnosis: right knee lateral meniscal tear   [facility]   Surgeon: Kathryne HitchBlackman, Christopher Y, MD      DISCUSSION: Patient is a 73 year old female scheduled for the above procedure.   History includes never smoker, palpitations, IBS, HLD, vertigo, pre-diabetes, Factor II mutation (without personal history of blood clots or post-operative bleeding; patient diagnosed in 2009 after daughter admitted with PE and work-up showed Factor II mutation). - Admission 02/2016 for chest pain. Seen by Armanda Magicurner, Traci, MD. Troponin negative x 5. She had an unremarkable echo followed by non-ischemic stress test.   Patient provided Factor II, DNA Analysis lab testing documentation from 05/02/08: A single G-20210-A mutation identified (Heterozygote).  A point mutation (G20210A) in factor II (prothrombin) gene is the second most common cause of inherited thrombosis and accounts for up to 20% of inherited thrombophilia... Heterozygous carriers of this mutation have prothrombin levels that are 30% higher than normal, associated with a 3 fold increase for venous thrombosis, but the risk cannot be definitively quantified at this time due to limited data... Another common cause of thrombosis is the factor V Leiden mutation (R506Q).  Up to 40% of the Factor II/prothrombin mutation carriers also carries a factor Leiden mutation.  Testing of other known causes of thrombophilia may also be pursued.  These include the R506Q (Leiden) mutation and the Factor V gene, plasma homocystine levels, as well as testing for deficiencies of antithrombin III, protein C and protein S.  I called and spoke with patient. She is a retired Engineer, civil (consulting)nurse. She has discussed Factor II mutation history with surgeon. She will likely be placed on  short-term post-operative rivaroxaban (or similar agent) for DVT prophylaxis.   Discussed Factor II mutation with anesthesiologist Achille RichHodierne, Adam, MD. Add coags (PT/PTT) to preoperative labs. If results are significantly abnormal then could consider contacting hematology for preoperative input. (I sent a staff message to Dr. Magnus IvanBlackman and Richardean Canallark, Gilbert, PA-C about this.)  Preoperative COVID-19 test was negative.    PROVIDERS: Soundra PilonBrake, Andrew R, FNP is PCP [facility]   LABS: She is for labs on arrival.    EKG: She is for EKG on arrival (if one not received within the past year).   CV: Nuclear stress test 03/26/16: Study Highlights:  Nuclear stress EF: 76%. There is inferior wall hypokinesis.  There was no ST segment deviation noted during stress.  Defect 1: There is a small defect of mild severity present in the apex location.  This is a low risk study. No ischemia identified. Donato SchultzMark Skains, MD   Echo 03/09/16: Study Conclusions - Left ventricle: The cavity size was normal. There was mild focal   basal hypertrophy of the septum. Systolic function was normal.   The estimated ejection fraction was in the range of 60% to 65%.   Wall motion was normal; there were no regional wall motion   abnormalities. There was an increased relative contribution of   atrial contraction to ventricular filling. Doppler parameters are   consistent with abnormal left ventricular relaxation (grade 1   diastolic dysfunction). - Mitral valve: There was trivial regurgitation. - Tricuspid valve: There was trivial regurgitation.   Past Medical History:  Diagnosis Date  . Arthritis   . Depression   . Diverticulitis   . 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    . IBS (irritable bowel syndrome)   . Other and unspecified hyperlipidemia   . Palpitations   . Pre-diabetes   . Vertigo     Past Surgical History:   Procedure Laterality Date  . ANTERIOR AND POSTERIOR REPAIR    . CATARACT EXTRACTION  1998, 2003   bilateral  . LAPAROSCOPIC CHOLECYSTECTOMY  2006  . TONSILLECTOMY     and adenoids  . TOTAL ABDOMINAL HYSTERECTOMY  1985  . TUBAL LIGATION      MEDICATIONS: No current facility-administered medications for this encounter.    Marland Kitchen acetaminophen (TYLENOL) 500 MG tablet  . ALPRAZolam (XANAX) 0.5 MG tablet  . Ascorbic Acid (VITAMIN C) 1000 MG tablet  . aspirin EC 81 MG tablet  . B Complex Vitamins (B COMPLEX-B12) TABS  . Biotin (BIOTIN 5000) 5 MG CAPS  . Calcium Carbonate-Vit D-Min (CALCIUM 1200 PO)  . Cholecalciferol (VITAMIN D) 2000 units CAPS  . Coenzyme Q10 (COQ10) 200 MG CAPS  . Fish Oil-Krill Oil (KRILL OIL PLUS) CAPS  . hyoscyamine (LEVSIN SL) 0.125 MG SL tablet  . loratadine (CLARITIN) 10 MG tablet  . MAGNESIUM PO  . Multiple Vitamin (MULTIVITAMIN WITH MINERALS) TABS tablet  . PARoxetine (PAXIL) 20 MG tablet  . Potassium 99 MG TABS  . Probiotic Product (PROBIOTIC DAILY PO)  . simvastatin (ZOCOR) 10 MG tablet  . Vitamin A 2400 MCG (8000 UT) TABS  . VITAMIN E PO  . pantoprazole (PROTONIX) 40 MG tablet    Myra Gianotti, PA-C Surgical Short Stay/Anesthesiology [facility] Phone [phone] [facility] Phone [phone] 01/05/2019 2:17 PM

## 2019-01-05 NOTE — Anesthesia Preprocedure Evaluation (Addendum)
Anesthesia Evaluation  Patient identified by MRN, date of birth, ID band Patient awake    Reviewed: Allergy & Precautions, NPO status , Patient's Chart, lab work & pertinent test results  History of Anesthesia Complications Negative for: history of anesthetic complications  Airway Mallampati: II  TM Distance: >3 FB Neck ROM: Full    Dental no notable dental hx. (+) Dental Advisory Given   Pulmonary neg pulmonary ROS,    Pulmonary exam normal        Cardiovascular negative cardio ROS Normal cardiovascular exam     Neuro/Psych PSYCHIATRIC DISORDERS Depression negative neurological ROS     GI/Hepatic negative GI ROS, Neg liver ROS,   Endo/Other  negative endocrine ROS  Renal/GU negative Renal ROS     Musculoskeletal negative musculoskeletal ROS (+)   Abdominal   Peds  Hematology negative hematology ROS (+)   Anesthesia Other Findings Day of surgery medications reviewed with the patient.  Reproductive/Obstetrics                            Anesthesia Physical Anesthesia Plan  ASA: II  Anesthesia Plan: General   Post-op Pain Management:    Induction: Intravenous  PONV Risk Score and Plan: 3 and Ondansetron, Dexamethasone and Diphenhydramine  Airway Management Planned:   Additional Equipment:   Intra-op Plan:   Post-operative Plan: Extubation in OR  Informed Consent: I have reviewed the patients History and Physical, chart, labs and discussed the procedure including the risks, benefits and alternatives for the proposed anesthesia with the patient or authorized representative who has indicated his/her understanding and acceptance.     Dental advisory given  Plan Discussed with: Anesthesiologist  Anesthesia Plan Comments: ([place]. She is getting STAT labs on arrival including PT/PTT. History of factor II mutation. See my note. Myra Gianotti, PA-C   )       Anesthesia Quick Evaluation

## 2019-01-06 ENCOUNTER — Encounter (HOSPITAL_COMMUNITY): Payer: Self-pay | Admitting: General Practice

## 2019-01-06 ENCOUNTER — Encounter (HOSPITAL_COMMUNITY)
Admission: RE | Disposition: A | Discharge status: Home / Self Care | Payer: Self-pay | Source: Home / Self Care | Attending: Orthopaedic Surgery

## 2019-01-06 ENCOUNTER — Ambulatory Visit (HOSPITAL_COMMUNITY)
Admission: RE | Admit: 2019-01-06 | Discharge: 2019-01-06 | Disposition: A | Discharge status: Home / Self Care | Payer: Medicare Other | Attending: Orthopaedic Surgery | Admitting: Orthopaedic Surgery

## 2019-01-06 ENCOUNTER — Ambulatory Visit (HOSPITAL_COMMUNITY): Payer: Medicare Other | Admitting: Vascular Surgery

## 2019-01-06 ENCOUNTER — Other Ambulatory Visit: Payer: Self-pay

## 2019-01-06 DIAGNOSIS — M659 Synovitis and tenosynovitis, unspecified: Secondary | ICD-10-CM | POA: Insufficient documentation

## 2019-01-06 DIAGNOSIS — M23202 Derangement of unspecified lateral meniscus due to old tear or injury, unspecified knee: Secondary | ICD-10-CM | POA: Diagnosis present

## 2019-01-06 DIAGNOSIS — E785 Hyperlipidemia, unspecified: Secondary | ICD-10-CM | POA: Diagnosis not present

## 2019-01-06 DIAGNOSIS — M23200 Derangement of unspecified lateral meniscus due to old tear or injury, right knee: Secondary | ICD-10-CM

## 2019-01-06 DIAGNOSIS — Z79899 Other long term (current) drug therapy: Secondary | ICD-10-CM | POA: Diagnosis not present

## 2019-01-06 DIAGNOSIS — Z7982 Long term (current) use of aspirin: Secondary | ICD-10-CM | POA: Diagnosis not present

## 2019-01-06 DIAGNOSIS — M94261 Chondromalacia, right knee: Secondary | ICD-10-CM | POA: Insufficient documentation

## 2019-01-06 DIAGNOSIS — X58XXXA Exposure to other specified factors, initial encounter: Secondary | ICD-10-CM | POA: Insufficient documentation

## 2019-01-06 DIAGNOSIS — R7303 Prediabetes: Secondary | ICD-10-CM | POA: Diagnosis not present

## 2019-01-06 DIAGNOSIS — Z8249 Family history of ischemic heart disease and other diseases of the circulatory system: Secondary | ICD-10-CM | POA: Diagnosis not present

## 2019-01-06 DIAGNOSIS — F329 Major depressive disorder, single episode, unspecified: Secondary | ICD-10-CM | POA: Insufficient documentation

## 2019-01-06 DIAGNOSIS — S83271A Complex tear of lateral meniscus, current injury, right knee, initial encounter: Secondary | ICD-10-CM | POA: Diagnosis not present

## 2019-01-06 DIAGNOSIS — M25561 Pain in right knee: Secondary | ICD-10-CM | POA: Diagnosis present

## 2019-01-06 HISTORY — DX: Prediabetes: R73.03

## 2019-01-06 HISTORY — PX: KNEE ARTHROSCOPY: SHX127

## 2019-01-06 HISTORY — DX: Depression, unspecified: F32.A

## 2019-01-06 HISTORY — DX: Unspecified osteoarthritis, unspecified site: M19.90

## 2019-01-06 HISTORY — DX: Other specified coagulation defects: D68.8

## 2019-01-06 LAB — BASIC METABOLIC PANEL
Anion gap: 11 (ref 5–15)
BUN: 18 mg/dL (ref 8–23)
CO2: 21 mmol/L — ABNORMAL LOW (ref 22–32)
Calcium: 9.4 mg/dL (ref 8.9–10.3)
Chloride: 107 mmol/L (ref 98–111)
Creatinine, Ser: 0.77 mg/dL (ref 0.44–1.00)
GFR calc Af Amer: >60 mL/min (ref >60)
GFR calc non Af Amer: >60 mL/min (ref >60)
Glucose, Bld: 139 mg/dL — ABNORMAL HIGH (ref 70–99)
Potassium: 3.6 mmol/L (ref 3.5–5.1)
Sodium: 139 mmol/L (ref 135–145)

## 2019-01-06 LAB — APTT: aPTT: 33 seconds (ref 24–36)

## 2019-01-06 LAB — CBC
HCT: 41.5 % (ref 36.0–46.0)
Hemoglobin: 13.1 g/dL (ref 12.0–15.0)
MCH: 27.1 pg (ref 26.0–34.0)
MCHC: 31.6 g/dL (ref 30.0–36.0)
MCV: 85.9 fL (ref 80.0–100.0)
Platelets: 260 10*3/uL (ref 150–400)
RBC: 4.83 MIL/uL (ref 3.87–5.11)
RDW: 13.1 % (ref 11.5–15.5)
WBC: 8.2 10*3/uL (ref 4.0–10.5)
nRBC: 0 % (ref 0.0–0.2)

## 2019-01-06 LAB — PROTIME-INR
INR: 1 (ref 0.8–1.2)
Prothrombin Time: 13.3 seconds (ref 11.4–15.2)

## 2019-01-06 LAB — GLUCOSE, CAPILLARY: Glucose-Capillary: 131 mg/dL — ABNORMAL HIGH (ref 70–99)

## 2019-01-06 SURGERY — ARTHROSCOPY, KNEE
Anesthesia: General | Site: Knee | Laterality: Right

## 2019-01-06 MED ORDER — LIDOCAINE 2% (20 MG/ML) 5 ML SYRINGE
INTRAMUSCULAR | Status: DC | PRN
  Administered 2019-01-06: 70 mg via INTRAVENOUS

## 2019-01-06 MED ORDER — LACTATED RINGERS IV SOLN
INTRAVENOUS | Status: DC
  Administered 2019-01-06 (×2): via INTRAVENOUS

## 2019-01-06 MED ORDER — EPHEDRINE 5 MG/ML INJ
INTRAVENOUS | Status: AC
  Filled 2019-01-06: qty 10

## 2019-01-06 MED ORDER — BUPIVACAINE HCL (PF) 0.25 % IJ SOLN
INTRAMUSCULAR | Status: DC | PRN
  Administered 2019-01-06: 20 mL via INTRA_ARTICULAR

## 2019-01-06 MED ORDER — DEXAMETHASONE SODIUM PHOSPHATE 10 MG/ML IJ SOLN
INTRAMUSCULAR | Status: DC | PRN
  Administered 2019-01-06: 10 mg via INTRAVENOUS

## 2019-01-06 MED ORDER — SODIUM CHLORIDE 0.9 % IR SOLN
Status: DC | PRN
  Administered 2019-01-06: 6000 mL

## 2019-01-06 MED ORDER — LACTATED RINGERS IV SOLN
INTRAVENOUS | Status: DC | PRN
  Administered 2019-01-06 (×2): via INTRAVENOUS

## 2019-01-06 MED ORDER — ROCURONIUM BROMIDE 10 MG/ML (PF) SYRINGE
PREFILLED_SYRINGE | INTRAVENOUS | Status: DC | PRN
  Administered 2019-01-06: 30 mg via INTRAVENOUS

## 2019-01-06 MED ORDER — DIPHENHYDRAMINE HCL 50 MG/ML IJ SOLN
INTRAMUSCULAR | Status: AC
  Filled 2019-01-06: qty 1

## 2019-01-06 MED ORDER — SUGAMMADEX SODIUM 200 MG/2ML IV SOLN
INTRAVENOUS | Status: DC | PRN
  Administered 2019-01-06: 100 mg via INTRAVENOUS
  Administered 2019-01-06: 200 mg via INTRAVENOUS

## 2019-01-06 MED ORDER — DEXAMETHASONE SODIUM PHOSPHATE 10 MG/ML IJ SOLN
INTRAMUSCULAR | Status: AC
  Filled 2019-01-06: qty 1

## 2019-01-06 MED ORDER — ONDANSETRON 4 MG PO TBDP: 4.0000 mg | ORAL_TABLET | Freq: Three times a day (TID) | ORAL | 0 refills | Status: DC | PRN

## 2019-01-06 MED ORDER — ACETAMINOPHEN 500 MG PO TABS
1000.0000 mg | ORAL_TABLET | Freq: Once | ORAL | Status: AC
  Administered 2019-01-06: 08:00:00 1000 mg via ORAL
  Filled 2019-01-06: qty 2

## 2019-01-06 MED ORDER — SUCCINYLCHOLINE CHLORIDE 200 MG/10ML IV SOSY
PREFILLED_SYRINGE | INTRAVENOUS | Status: AC
  Filled 2019-01-06: qty 10

## 2019-01-06 MED ORDER — PROPOFOL 10 MG/ML IV BOLUS
INTRAVENOUS | Status: DC | PRN
  Administered 2019-01-06: 50 mg via INTRAVENOUS
  Administered 2019-01-06: 150 mg via INTRAVENOUS

## 2019-01-06 MED ORDER — LIDOCAINE 2% (20 MG/ML) 5 ML SYRINGE
INTRAMUSCULAR | Status: AC
  Filled 2019-01-06: qty 5

## 2019-01-06 MED ORDER — CEFAZOLIN SODIUM-DEXTROSE 2-4 GM/100ML-% IV SOLN
2.0000 g | INTRAVENOUS | Status: AC
  Administered 2019-01-06: 2 g via INTRAVENOUS
  Filled 2019-01-06: qty 100

## 2019-01-06 MED ORDER — PHENYLEPHRINE HCL-NACL 10-0.9 MG/250ML-% IV SOLN
INTRAVENOUS | Status: AC
  Filled 2019-01-06: qty 750

## 2019-01-06 MED ORDER — MORPHINE SULFATE (PF) 4 MG/ML IV SOLN
INTRAVENOUS | Status: DC | PRN
  Administered 2019-01-06: 4 mg via INTRAMUSCULAR

## 2019-01-06 MED ORDER — ROCURONIUM BROMIDE 10 MG/ML (PF) SYRINGE
PREFILLED_SYRINGE | INTRAVENOUS | Status: AC
  Filled 2019-01-06: qty 10

## 2019-01-06 MED ORDER — PHENYLEPHRINE 40 MCG/ML (10ML) SYRINGE FOR IV PUSH (FOR BLOOD PRESSURE SUPPORT)
PREFILLED_SYRINGE | INTRAVENOUS | Status: DC | PRN
  Administered 2019-01-06 (×4): 80 ug via INTRAVENOUS

## 2019-01-06 MED ORDER — DIPHENHYDRAMINE HCL 50 MG/ML IJ SOLN
INTRAMUSCULAR | Status: DC | PRN
  Administered 2019-01-06: 12.5 mg via INTRAVENOUS

## 2019-01-06 MED ORDER — RIVAROXABAN 10 MG PO TABS: 10.0000 mg | ORAL_TABLET | Freq: Every day | ORAL | 0 refills | Status: DC

## 2019-01-06 MED ORDER — ONDANSETRON HCL 4 MG/2ML IJ SOLN
INTRAMUSCULAR | Status: AC
  Filled 2019-01-06: qty 2

## 2019-01-06 MED ORDER — MORPHINE SULFATE (PF) 4 MG/ML IV SOLN
INTRAVENOUS | Status: AC
  Filled 2019-01-06: qty 1

## 2019-01-06 MED ORDER — BUPIVACAINE HCL (PF) 0.25 % IJ SOLN
INTRAMUSCULAR | Status: AC
  Filled 2019-01-06: qty 30

## 2019-01-06 MED ORDER — ONDANSETRON HCL 4 MG/2ML IJ SOLN
INTRAMUSCULAR | Status: DC | PRN
  Administered 2019-01-06: 4 mg via INTRAVENOUS

## 2019-01-06 MED ORDER — TRAMADOL HCL 50 MG PO TABS: 50.0000 mg | ORAL_TABLET | Freq: Four times a day (QID) | ORAL | 0 refills | Status: AC | PRN

## 2019-01-06 MED ORDER — SUCCINYLCHOLINE CHLORIDE 200 MG/10ML IV SOSY
PREFILLED_SYRINGE | INTRAVENOUS | Status: DC | PRN
  Administered 2019-01-06: 120 mg via INTRAVENOUS

## 2019-01-06 MED ORDER — PHENYLEPHRINE 40 MCG/ML (10ML) SYRINGE FOR IV PUSH (FOR BLOOD PRESSURE SUPPORT)
PREFILLED_SYRINGE | INTRAVENOUS | Status: AC
  Filled 2019-01-06: qty 10

## 2019-01-06 MED ORDER — FENTANYL CITRATE (PF) 250 MCG/5ML IJ SOLN
INTRAMUSCULAR | Status: AC
  Filled 2019-01-06: qty 5

## 2019-01-06 MED ORDER — PROMETHAZINE HCL 25 MG/ML IJ SOLN: 6.2500 mg | INTRAMUSCULAR | Status: DC | PRN

## 2019-01-06 MED ORDER — FENTANYL CITRATE (PF) 100 MCG/2ML IJ SOLN: 25.0000 ug | INTRAMUSCULAR | Status: DC | PRN

## 2019-01-06 MED ORDER — CHLORHEXIDINE GLUCONATE 4 % EX LIQD: 60.0000 mL | Freq: Once | CUTANEOUS | Status: DC

## 2019-01-06 MED ORDER — SCOPOLAMINE 1 MG/3DAYS TD PT72
1.0000 | MEDICATED_PATCH | TRANSDERMAL | Status: DC
  Administered 2019-01-06: 08:00:00 1.5 mg via TRANSDERMAL
  Filled 2019-01-06: qty 1

## 2019-01-06 MED ORDER — FENTANYL CITRATE (PF) 250 MCG/5ML IJ SOLN
INTRAMUSCULAR | Status: DC | PRN
  Administered 2019-01-06: 50 ug via INTRAVENOUS

## 2019-01-06 SURGICAL SUPPLY — 40 items
BLADE CLIPPER SURG (BLADE) IMPLANT
BNDG CMPR MED 10X6 ELC LF (GAUZE/BANDAGES/DRESSINGS) ×1
BNDG ELASTIC 6X10 VLCR STRL LF (GAUZE/BANDAGES/DRESSINGS) ×2 IMPLANT
BNDG ELASTIC 6X5.8 VLCR STR LF (GAUZE/BANDAGES/DRESSINGS) ×3 IMPLANT
COVER WAND RF STERILE (DRAPES) IMPLANT
DISSECTOR  3.8MM X 13CM (MISCELLANEOUS) ×2
DISSECTOR 3.8MM X 13CM (MISCELLANEOUS) ×1 IMPLANT
DRAPE ARTHROSCOPY W/POUCH 114 (DRAPES) ×3 IMPLANT
DRAPE U-SHAPE 47X51 STRL (DRAPES) ×3 IMPLANT
DRSG PAD ABDOMINAL 8X10 ST (GAUZE/BANDAGES/DRESSINGS) IMPLANT
DURAPREP 26ML APPLICATOR (WOUND CARE) ×3 IMPLANT
GAUZE SPONGE 4X4 12PLY STRL (GAUZE/BANDAGES/DRESSINGS) ×3 IMPLANT
GAUZE XEROFORM 1X8 LF (GAUZE/BANDAGES/DRESSINGS) ×3 IMPLANT
GLOVE BIOGEL PI IND STRL 7.0 (GLOVE) IMPLANT
GLOVE BIOGEL PI IND STRL 8 (GLOVE) ×2 IMPLANT
GLOVE BIOGEL PI INDICATOR 7.0 (GLOVE) ×6
GLOVE BIOGEL PI INDICATOR 8 (GLOVE) ×4
GLOVE ORTHO TXT STRL SZ7.5 (GLOVE) ×3 IMPLANT
GLOVE SURG ORTHO 8.0 STRL STRW (GLOVE) ×3 IMPLANT
GLOVE SURG SS PI 6.5 STRL IVOR (GLOVE) ×4 IMPLANT
GOWN STRL REUS W/ TWL LRG LVL3 (GOWN DISPOSABLE) ×1 IMPLANT
GOWN STRL REUS W/ TWL XL LVL3 (GOWN DISPOSABLE) ×2 IMPLANT
GOWN STRL REUS W/TWL LRG LVL3 (GOWN DISPOSABLE) ×3
GOWN STRL REUS W/TWL XL LVL3 (GOWN DISPOSABLE) ×6
KIT TURNOVER KIT B (KITS) ×3 IMPLANT
MANIFOLD NEPTUNE II (INSTRUMENTS) IMPLANT
NDL SPNL 18GX3.5 QUINCKE PK (NEEDLE) ×1 IMPLANT
NEEDLE SPNL 18GX3.5 QUINCKE PK (NEEDLE) ×3 IMPLANT
PACK ARTHROSCOPY DSU (CUSTOM PROCEDURE TRAY) ×3 IMPLANT
PAD ABD 8X10 STRL (GAUZE/BANDAGES/DRESSINGS) ×2 IMPLANT
PAD ARMBOARD 7.5X6 YLW CONV (MISCELLANEOUS) ×6 IMPLANT
PADDING CAST COTTON 6X4 STRL (CAST SUPPLIES) ×3 IMPLANT
SPONGE LAP 4X18 RFD (DISPOSABLE) ×3 IMPLANT
SUT ETHILON 3 0 PS 1 (SUTURE) ×5 IMPLANT
TOWEL GREEN STERILE (TOWEL DISPOSABLE) ×3 IMPLANT
TOWEL GREEN STERILE FF (TOWEL DISPOSABLE) ×3 IMPLANT
TUBING ARTHROSCOPY IRRIG 16FT (MISCELLANEOUS) ×3 IMPLANT
WAND STAR VAC 90 (SURGICAL WAND) IMPLANT
WATER STERILE IRR 1000ML POUR (IV SOLUTION) ×3 IMPLANT
WRAP KNEE MAXI GEL POST OP (GAUZE/BANDAGES/DRESSINGS) ×2 IMPLANT

## 2019-01-06 NOTE — Discharge Instructions (Signed)
You may put all of your weight on your right knee/leg as comfort allows. Expect a lot of knee swelling - ice and elevation as needed. You can remove your dressings in 1-2 days and start getting your incisions wet in the shower daily. Place small band-aids over your incisions daily after you shower. Do pump your feet occasionally thru out the day.

## 2019-01-06 NOTE — Transfer of Care (Signed)
Immediate Anesthesia Transfer of Care Note  Patient: [name]  Procedure(s) Performed: RIGHT KNEE ARTHROSCOPY WITH DEBRIDEMENT AND PARTIAL LATERAL MENISCECTOMY (Right Knee)  Patient Location: PACU  Anesthesia Type:General  Level of Consciousness: drowsy, patient cooperative and responds to stimulation  Airway & Oxygen Therapy: Patient Spontanous Breathing and Patient connected to face mask oxygen  Post-op Assessment: Report given to RN and Post -op Vital signs reviewed and stable  Post vital signs: Reviewed and stable  Last Vitals:  Vitals Value Taken Time  BP 162/70 01/06/19 0956  Temp    Pulse 72 01/06/19 0959  Resp 16 01/06/19 0959  SpO2 100 % 01/06/19 0959  Vitals shown include unvalidated device data.  Last Pain:  Vitals:   01/06/19 0801  TempSrc:   PainSc: 5       Patients Stated Pain Goal: 3 (37/62/83 1517)  Complications: No apparent anesthesia complications

## 2019-01-06 NOTE — Op Note (Signed)
NAME: [name], [name] MEDICAL RECORD [id number] ACCOUNT [id number] DATE OF BIRTH:[birth year] FACILITY[facility] LOCATION: [facility]-PERI[facility] PHYSICIAN:Sharian Delia Kerry Fort, MD  OPERATIVE REPORT  DATE OF PROCEDURE:  01/06/2019  PREOPERATIVE DIAGNOSIS:  Right knee symptomatic lateral meniscal tear.  POSTOPERATIVE DIAGNOSIS:  Right knee symptomatic lateral meniscal tear.  PROCEDURE:  Right knee arthroscopy with partial lateral meniscectomy.  FINDINGS:  Large macerated lateral meniscal tear from the posterior area to the midbody and slightly anterior.  SURGEON:  Lind Guest. Ninfa Linden, MD  ASSISTANT:  Erskine Emery, PA-C  ANESTHESIA:   1.  General. 2.  Local with 0.25% plain Marcaine.  ESTIMATED BLOOD LOSS:  Minimal.  COMPLICATIONS:  None.  INDICATIONS:  The patient is a 73 year old active female with right lateral knee pain and locking and catching.  A steroid injection did help temporize her symptoms some, but she continues to have locking, catching and pain.  She also has some lateral  leg pain.  An MRI of her right knee was obtained and did show a large lateral meniscal tear.  At this point, with the failure of conservative treatment, we have recommended a knee arthroscopy.  Risks and benefits of the surgery were explained to her in  detail.  We showed her a knee model and explained what the surgery involves.  We talked about the risk of acute blood loss anemia, nerve or vessel injury, fracture, infection and DVT.  There is a family history of DVT, so we will put her on Xarelto  postoperatively.  DESCRIPTION OF PROCEDURE:  After informed consent was obtained and appropriate right knee was marked, she was brought to the operating room and placed supine on the operating table.  General anesthesia was then obtained.  We did not need to apply any  tourniquet.  Her right thigh, knee, leg and ankle were prepped and draped with DuraPrep and sterile drapes including a sterile stockinette with a  lateral leg post-utilized.  The bed was raised and the right operative knee was flexed off the side table.   A time-out was called to identify correct patient, correct right knee.  We then made an anterior lateral arthroscopy portal and inserted a cannula in the knee.  There was no significant effusion.  We went to the medial aspect of the knee and made an  anterior medial incision and placed a camera in the knee.  The medial compartment was intact.  There was minimal cartilage wear and the medial meniscus was normal.  ACL and PCL were intact.  We went to the lateral compartment of the knee in a  figure-of-four position.  We found a large complex tear of the lateral meniscus.  Using arthroscopic shaver and basket forceps/biters we were able to perform a partial lateral meniscectomy and get this back to a stable margin.  There was at least some  grade II to grade III chondromalacia of lateral femoral condyle, but this was not down to full bone.  We then assessed the patellofemoral joint and found just some synovitis there that was minimal and some mild cartilage irregularity of the patella,  which was not worrisome.  We debrided this back to a stable margin.  We then allowed fluid lavage of the knee and drained all the fluid from the knee.  Portal sites were closed with nylon suture.  We placed Marcaine into the knee and the portal sites as  well.  A well-padded sterile dressing was applied.  She was awakened, extubated, and taken to recovery room in stable  condition.  All final counts were correct.  There were no complications noted.  Postoperatively, we will allow her to weightbear as  tolerated with slow increase her activities.  We will see her back in the office in a week.  TN/NUANCE  D:01/06/2019 T:01/06/2019 JOB:[id number]

## 2019-01-06 NOTE — H&P (Signed)
Pearla DubonnetLinda K Dobransky is an 73 y.o. female.   Chief Complaint:   Right knee pain with instability HPI:   73 yo female with recurrent right knee pain with also right locking and catching.  Has tried and failed conservative treatment.  A MRI of the right knee does show a lateral meniscal tear.  With the failure of conservative treatment, a right knee arthroscopy has been recommended.  Past Medical History:  Diagnosis Date  . Arthritis   . Depression   . Diverticulitis   . 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    . IBS (irritable bowel syndrome)   . Other and unspecified hyperlipidemia   . Palpitations   . Pre-diabetes   . Vertigo     Past Surgical History:  Procedure Laterality Date  . ANTERIOR AND POSTERIOR REPAIR    . CATARACT EXTRACTION  1998, 2003   bilateral  . LAPAROSCOPIC CHOLECYSTECTOMY  2006  . TONSILLECTOMY     and adenoids  . TOTAL ABDOMINAL HYSTERECTOMY  1985  . TUBAL LIGATION      Family History  Problem Relation Age of Onset  . Heart disease Mother   . Uterine cancer Mother   . Diabetes Mother   . Hypertension Father   . Prostate cancer Father   . Hypertension Sister    Social History:  reports that she has never smoked. She has never used smokeless tobacco. She reports that she does not drink alcohol or use drugs.  Allergies:  Allergies  Allergen Reactions  . Codeine Nausea And Vomiting  . Demerol [Meperidine] Itching and Rash  . Robitussin Dm [Dextromethorphan-Guaifenesin]     Feels Jerky    Medications Prior to Admission  Medication Sig Dispense Refill  . acetaminophen (TYLENOL) 500 MG tablet Take 1,000 mg by mouth every 6 (six) hours as needed for moderate pain.    . Ascorbic Acid (VITAMIN C) 1000 MG tablet Take 1,000 mg by mouth every evening.     Marland Kitchen. aspirin EC 81 MG tablet Take 81 mg by mouth every evening.     . B Complex Vitamins (B COMPLEX-B12) TABS Take 1 tablet by  mouth daily.    . Biotin (BIOTIN 5000) 5 MG CAPS Take 5 mg by mouth daily.    . Calcium Carbonate-Vit D-Min (CALCIUM 1200 PO) Take 1,200 mg by mouth every evening.    . Cholecalciferol (VITAMIN D) 2000 units CAPS Take 2,000 Units by mouth every evening.     . Coenzyme Q10 (COQ10) 200 MG CAPS Take 200 mg by mouth daily.     . Fish Oil-Krill Oil (KRILL OIL PLUS) CAPS Take 500 mg by mouth daily.     Marland Kitchen. loratadine (CLARITIN) 10 MG tablet Take 10 mg by mouth every evening.    Marland Kitchen. MAGNESIUM PO Take 500 mg by mouth every evening.     . Multiple Vitamin (MULTIVITAMIN WITH MINERALS) TABS tablet Take 1 tablet by mouth daily.    Marland Kitchen. PARoxetine (PAXIL) 20 MG tablet Take 10 mg by mouth every morning.     . Potassium 99 MG TABS Take 99 mg by mouth every evening.     . Probiotic Product (PROBIOTIC DAILY PO) Take 1 capsule by mouth daily.    . simvastatin (ZOCOR) 10 MG tablet Take 10 mg by mouth every evening.     . Vitamin A 2400 MCG (8000 UT) TABS Take 2,400 mcg by mouth daily.    Marland Kitchen. VITAMIN E  PO Take 2,400 mcg by mouth daily.     Marland Kitchen ALPRAZolam (XANAX) 0.5 MG tablet Take 0.5 mg by mouth 3 (three) times daily as needed for anxiety.     . hyoscyamine (LEVSIN SL) 0.125 MG SL tablet Take 0.125 mg by mouth every 4 (four) hours as needed for pain.    . pantoprazole (PROTONIX) 40 MG tablet Take 1 tablet (40 mg total) by mouth daily. (Patient not taking: Reported on 01/03/2019) 30 tablet 0    Results for orders placed or performed during the hospital encounter of 01/06/19 (from the past 48 hour(s))  Glucose, capillary     Status: Abnormal   Collection Time: 01/06/19  6:54 AM  Result Value Ref Range   Glucose-Capillary 131 (H) 70 - 99 mg/dL  CBC     Status: None   Collection Time: 01/06/19  7:08 AM  Result Value Ref Range   WBC 8.2 4.0 - 10.5 K/uL   RBC 4.83 3.87 - 5.11 MIL/uL   Hemoglobin 13.1 12.0 - 15.0 g/dL   HCT 41.5 36.0 - 46.0 %   MCV 85.9 80.0 - 100.0 fL   MCH 27.1 26.0 - 34.0 pg   MCHC 31.6 30.0 - 36.0  g/dL   RDW 13.1 11.5 - 15.5 %   Platelets 260 150 - 400 K/uL   nRBC 0.0 0.0 - 0.2 %    Comment: Performed at [facility], [place] [address] [place], [place] [address]  Basic metabolic panel     Status: Abnormal   Collection Time: 01/06/19  7:08 AM  Result Value Ref Range   Sodium 139 135 - 145 mmol/L   Potassium 3.6 3.5 - 5.1 mmol/L   Chloride 107 98 - 111 mmol/L   CO2 21 (L) 22 - 32 mmol/L   Glucose, Bld 139 (H) 70 - 99 mg/dL   BUN 18 8 - 23 mg/dL   Creatinine, Ser 0.77 0.44 - 1.00 mg/dL   Calcium 9.4 8.9 - 10.3 mg/dL   GFR calc non Af Amer >60 >60 mL/min   GFR calc Af Amer >60 >60 mL/min   Anion gap 11 5 - 15    Comment: Performed at [facility], [place] [address] [place], [place] [address]  APTT     Status: None   Collection Time: 01/06/19  7:08 AM  Result Value Ref Range   aPTT 33 24 - 36 seconds    Comment: Performed at [facility] [address], [place], [place] [address]  Protime-INR     Status: None   Collection Time: 01/06/19  7:08 AM  Result Value Ref Range   Prothrombin Time 13.3 11.4 - 15.2 seconds   INR 1.0 0.8 - 1.2    Comment: (NOTE) INR goal varies based on device and disease states. Performed at [facility], [place] [address], [place], [place] [address]    No results found.  Review of Systems  Musculoskeletal: Positive for joint pain.  All other systems reviewed and are negative.   Blood pressure (!) 145/73, pulse 88, temperature 98.4 F (36.9 C), temperature source Oral, resp. rate 18, height 5\' 2"  (1.575 m), weight 76.8 kg, SpO2 99 %. Physical Exam  Constitutional: She is oriented to person, place, and time. She appears well-developed and well-nourished.  HENT:  Head: Normocephalic and atraumatic.  Eyes: Pupils are equal, round, and reactive to light. EOM are normal.  Neck: Normal range of motion.  Cardiovascular: Normal rate.  Respiratory:  Effort normal.  GI: Soft.  Musculoskeletal:     Right knee: She exhibits  decreased range of motion, effusion and abnormal meniscus. Tenderness found. Lateral joint line tenderness noted.  Neurological: She is alert and oriented to person, place, and time.  Skin: Skin is warm and dry.  Psychiatric: She has a normal mood and affect.     Assessment/Plan Right knee pain with lateral meniscal tear  To the OR today as an outpatient for a right knee arthroscopy.  Risks and benefits of surgery have been discussed and informed consent is obtained.  Kathryne Hitchhristopher Y Florence Yeung, MD 01/06/2019, 8:21 AM

## 2019-01-06 NOTE — Brief Op Note (Signed)
01/06/2019  9:51 AM  PATIENT:  [name]  73 y.o. female  PRE-OPERATIVE DIAGNOSIS:  right knee lateral meniscal tear  POST-OPERATIVE DIAGNOSIS:  Right knee lateral minisucal tear.  PROCEDURE:  Procedure(s): RIGHT KNEE ARTHROSCOPY WITH DEBRIDEMENT AND PARTIAL LATERAL MENISCECTOMY (Right)  SURGEON:  Surgeon(s) and Role:    * Mcarthur Rossetti, MD - Primary  PHYSICIAN ASSISTANT: Benita Stabile, PA-C  ANESTHESIA:   local and general  EBL:  25 mL   COUNTS:  YES  PLAN OF CARE: Discharge to home after PACU  PATIENT DISPOSITION:  PACU - hemodynamically stable.   Delay start of Pharmacological VTE agent (>24hrs) due to surgical blood loss or risk of bleeding: no

## 2019-01-06 NOTE — Anesthesia Postprocedure Evaluation (Signed)
Anesthesia Post Note  Patient: [name]  Procedure(s) Performed: RIGHT KNEE ARTHROSCOPY WITH DEBRIDEMENT AND PARTIAL LATERAL MENISCECTOMY (Right Knee)     Patient location during evaluation: PACU Anesthesia Type: General Level of consciousness: sedated Pain management: pain level controlled Vital Signs Assessment: post-procedure vital signs reviewed and stable Respiratory status: spontaneous breathing and respiratory function stable Cardiovascular status: stable Postop Assessment: no apparent nausea or vomiting Anesthetic complications: no    Last Vitals:  Vitals:   01/06/19 1044 01/06/19 1045  BP:    Pulse:  67  Resp: 15 13  Temp: (!) 36.2 C   SpO2:  96%    Last Pain:  Vitals:   01/06/19 1045  TempSrc:   PainSc: 0-No pain                 [name] DANIEL

## 2019-01-06 NOTE — Anesthesia Procedure Notes (Addendum)
Procedure Name: Intubation Date/Time: 01/06/2019 9:04 AM Performed by: Jearld Pies, CRNA Pre-anesthesia Checklist: Patient identified, Emergency Drugs available, Suction available and Patient being monitored Patient Re-evaluated:Patient Re-evaluated prior to induction Oxygen Delivery Method: Circle System Utilized Preoxygenation: Pre-oxygenation with 100% oxygen Induction Type: IV induction, Rapid sequence and Cricoid Pressure applied Laryngoscope Size: Mac and 3 Grade View: Grade I Tube type: Oral Tube size: 7.0 mm Number of attempts: 1 Airway Equipment and Method: Stylet Placement Confirmation: ETT inserted through vocal cords under direct vision,  positive ETCO2 and breath sounds checked- equal and bilateral Secured at: 21 cm Tube secured with: Tape Dental Injury: Teeth and Oropharynx as per pre-operative assessment  Comments: RSI d/t patient reports of nausea pre-operatively. Vocal cords clear, no gastric contents noted in oropharynx. Atraumatic intubation.

## 2019-01-07 ENCOUNTER — Encounter (HOSPITAL_COMMUNITY): Payer: Self-pay | Admitting: Orthopaedic Surgery

## 2019-01-13 ENCOUNTER — Encounter: Payer: Self-pay | Admitting: Physician Assistant

## 2019-01-13 ENCOUNTER — Ambulatory Visit (INDEPENDENT_AMBULATORY_CARE_PROVIDER_SITE_OTHER): Payer: Medicare Other | Admitting: Physician Assistant

## 2019-01-13 ENCOUNTER — Other Ambulatory Visit: Payer: Self-pay

## 2019-01-13 DIAGNOSIS — Z9889 Other specified postprocedural states: Secondary | ICD-10-CM

## 2019-01-13 NOTE — Progress Notes (Signed)
HPI: [name] returns today 1 week status post right knee arthroscopy.  She had a large complex tear of the lateral meniscus.  For which she underwent a partial lateral meniscectomy.  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  She is overall doing well.  She states that the pain lateral aspect of her proximal fibular region did come back yesterday.  She is had significant bruising.  She is on Xarelto due to family history of DVT  Physical exam: Full extension flexion and 90 degrees.  Port sites are well-healed and well approximated with nylon suture.  Calf supple nontender.  Impression: 1 week status post right knee partial lateral meniscectomy  Plan: She will work on range of motion strengthening the knee.  She is able to get the wounds wet but not submerging until completely healed.  Sutures were removed today.  Follow-up in 4 weeks.  She will return sooner if there is any questions or concerns.

## 2019-01-17 ENCOUNTER — Telehealth: Payer: Self-pay | Admitting: Orthopaedic Surgery

## 2019-01-17 NOTE — Telephone Encounter (Signed)
Wanted me to let you know that she was walking into the study this morning and hit her surgery site on the door frame, she said it did hurt quite a bit, has some discomfort back of leg/calf. Some blood came out of incision

## 2019-01-17 NOTE — Telephone Encounter (Signed)
Pt called in requesting a phone call back from [place], pt didn't want to talk to me.   [phone]

## 2019-02-10 ENCOUNTER — Encounter: Payer: Self-pay | Admitting: Physician Assistant

## 2019-02-10 ENCOUNTER — Ambulatory Visit (INDEPENDENT_AMBULATORY_CARE_PROVIDER_SITE_OTHER): Payer: Medicare Other | Admitting: Physician Assistant

## 2019-02-10 DIAGNOSIS — Z9889 Other specified postprocedural states: Secondary | ICD-10-CM

## 2019-02-10 NOTE — Progress Notes (Signed)
HPI: Mrs. Vu returns today 5 weeks status post right knee arthroscopy with debridement and partial lateral meniscectomy.  She states that she has good days bad days.  She denies any pain in the lateral aspect of the lower leg has improved some.  Overall she feels she is doing okay.  Physical exam: Right knee she has full extension full flexion.  No instability valgus varus stressing.  Port sites are healing well without signs of infection or dehiscence.  Right calf supple nontender  Impression: 5-week status post right knee arthroscopy with debridement and partial lateral meniscectomy  Plan: She will work on Forensic scientist.  We will see her back in 6 weeks if she still having pain we may consider cortisone injection may also consider supplemental injection in the future.  Should follow-up sooner if there is any questions or concerns.

## 2019-03-24 ENCOUNTER — Ambulatory Visit: Payer: Medicare Other | Admitting: Physician Assistant

## 2020-01-23 ENCOUNTER — Emergency Department (HOSPITAL_BASED_OUTPATIENT_CLINIC_OR_DEPARTMENT_OTHER)
Admission: EM | Admit: 2020-01-23 | Discharge: 2020-01-23 | Disposition: A | Discharge status: Home / Self Care | Payer: Medicare PPO | Attending: Emergency Medicine | Admitting: Emergency Medicine

## 2020-01-23 ENCOUNTER — Encounter (HOSPITAL_BASED_OUTPATIENT_CLINIC_OR_DEPARTMENT_OTHER): Payer: Self-pay | Admitting: *Deleted

## 2020-01-23 ENCOUNTER — Emergency Department (HOSPITAL_BASED_OUTPATIENT_CLINIC_OR_DEPARTMENT_OTHER): Payer: Medicare PPO

## 2020-01-23 ENCOUNTER — Other Ambulatory Visit: Payer: Self-pay

## 2020-01-23 DIAGNOSIS — Y998 Other external cause status: Secondary | ICD-10-CM | POA: Insufficient documentation

## 2020-01-23 DIAGNOSIS — Y9389 Activity, other specified: Secondary | ICD-10-CM | POA: Insufficient documentation

## 2020-01-23 DIAGNOSIS — W19XXXA Unspecified fall, initial encounter: Secondary | ICD-10-CM

## 2020-01-23 DIAGNOSIS — W010XXA Fall on same level from slipping, tripping and stumbling without subsequent striking against object, initial encounter: Secondary | ICD-10-CM | POA: Diagnosis not present

## 2020-01-23 DIAGNOSIS — Y92009 Unspecified place in unspecified non-institutional (private) residence as the place of occurrence of the external cause: Secondary | ICD-10-CM | POA: Diagnosis not present

## 2020-01-23 DIAGNOSIS — Z79899 Other long term (current) drug therapy: Secondary | ICD-10-CM | POA: Insufficient documentation

## 2020-01-23 DIAGNOSIS — S8392XA Sprain of unspecified site of left knee, initial encounter: Secondary | ICD-10-CM | POA: Diagnosis not present

## 2020-01-23 DIAGNOSIS — S8992XA Unspecified injury of left lower leg, initial encounter: Secondary | ICD-10-CM | POA: Diagnosis present

## 2020-01-23 DIAGNOSIS — R52 Pain, unspecified: Secondary | ICD-10-CM

## 2020-01-23 DIAGNOSIS — S86912A Strain of unspecified muscle(s) and tendon(s) at lower leg level, left leg, initial encounter: Secondary | ICD-10-CM

## 2020-01-23 NOTE — ED Provider Notes (Signed)
[facility] EMERGENCY DEPARTMENT Provider Note   CSN: [id number] Arrival date & time: 01/23/20  1349     History Chief Complaint  Patient presents with  . Fall    [name] is a 74 y.o. female.  Presents to ER with concern for fall.  Reports she was standing on approximately 2 foot stool at home when she lost her balance and fell, landing on her left side.  She denies hitting her head, denies losing consciousness.  She currently has no neck or back pain, chest or abdominal pain.  Her pain is primarily in her left buttocks and left knee.  She has been able to walk since the accident albeit with some pain.  Has cane at home for assistance.  No anticoagulation.  HPI     Past Medical History:  Diagnosis Date  . Arthritis   . Depression   . Diverticulitis   . 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    . IBS (irritable bowel syndrome)   . Other and unspecified hyperlipidemia   . Palpitations   . Pre-diabetes   . Vertigo     Patient Active Problem List   Diagnosis Date Noted  . 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 Pain in the chest   . Chest pain 03/08/2016  . Diverticulitis of colon 03/08/2016  . IBS (irritable bowel syndrome) 03/08/2016    Past Surgical History:  Procedure Laterality Date  . ANTERIOR AND POSTERIOR REPAIR    . CATARACT EXTRACTION  1998, 2003   bilateral  . KNEE ARTHROSCOPY Right 01/06/2019   Procedure: RIGHT KNEE ARTHROSCOPY WITH DEBRIDEMENT AND PARTIAL LATERAL MENISCECTOMY;  Surgeon: Kathryne Hitch, MD;  Location: [facility] OR;  Service: Orthopedics;  Laterality: Right;  . LAPAROSCOPIC CHOLECYSTECTOMY  2006  . TONSILLECTOMY     and adenoids  . TOTAL ABDOMINAL HYSTERECTOMY  1985  . TUBAL LIGATION       OB History   No obstetric history on file.     Family History  Problem Relation Age of Onset  . Heart disease Mother   . Uterine  cancer Mother   . Diabetes Mother   . Hypertension Father   . Prostate cancer Father   . Hypertension Sister     Social History   Tobacco Use  . Smoking status: Never Smoker  . Smokeless tobacco: Never Used  Vaping Use  . Vaping Use: Never used  Substance Use Topics  . Alcohol use: No  . Drug use: No    Home Medications Prior to Admission medications   Medication Sig Start Date End Date Taking? Authorizing Provider  acetaminophen (TYLENOL) 500 MG tablet Take 1,000 mg by mouth every 6 (six) hours as needed for moderate pain.    [provider]  ALPRAZolam Prudy Feeler) 0.5 MG tablet Take 0.5 mg by mouth 3 (three) times daily as needed for anxiety.  12/07/18   [provider]  Ascorbic Acid (VITAMIN C) 1000 MG tablet Take 1,000 mg by mouth every evening.     [provider]  aspirin EC 81 MG tablet Take 81 mg by mouth every evening.     [provider]  B Complex Vitamins (B COMPLEX-B12) TABS Take 1 tablet by mouth daily.    [provider]  Biotin (BIOTIN 5000) 5 MG CAPS Take 5 mg by mouth daily.    [provider]  Calcium Carbonate-Vit D-Min (CALCIUM 1200  PO) Take 1,200 mg by mouth every evening.    [provider]  Cholecalciferol (VITAMIN D) 2000 units CAPS Take 2,000 Units by mouth every evening.     [provider]  Coenzyme Q10 (COQ10) 200 MG CAPS Take 200 mg by mouth daily.     [provider]  Fish Oil-Krill Oil (KRILL OIL PLUS) CAPS Take 500 mg by mouth daily.     [provider]  hyoscyamine (LEVSIN SL) 0.125 MG SL tablet Take 0.125 mg by mouth every 4 (four) hours as needed for pain. 02/29/16   [provider]  loratadine (CLARITIN) 10 MG tablet Take 10 mg by mouth every evening.    [provider]  MAGNESIUM PO Take 500 mg by mouth every evening.     [provider]  Multiple Vitamin (MULTIVITAMIN WITH MINERALS) TABS tablet Take 1 tablet by mouth daily.     [provider]  ondansetron (ZOFRAN ODT) 4 MG disintegrating tablet Take 1 tablet (4 mg total) by mouth every 8 (eight) hours as needed for nausea or vomiting. 01/06/19   Kathryne Hitch, MD  pantoprazole (PROTONIX) 40 MG tablet Take 1 tablet (40 mg total) by mouth daily. Patient not taking: Reported on 01/03/2019 03/09/16   Maretta Bees, MD  PARoxetine (PAXIL) 20 MG tablet Take 10 mg by mouth every morning.     [provider]  Potassium 99 MG TABS Take 99 mg by mouth every evening.     [provider]  Probiotic Product (PROBIOTIC DAILY PO) Take 1 capsule by mouth daily.    [provider]  rivaroxaban (XARELTO) 10 MG TABS tablet Take 1 tablet (10 mg total) by mouth daily. 01/06/19   Kathryne Hitch, MD  simvastatin (ZOCOR) 10 MG tablet Take 10 mg by mouth every evening.  11/17/18   [provider]  Vitamin A 2400 MCG (8000 UT) TABS Take 2,400 mcg by mouth daily.    [provider]  VITAMIN E PO Take 2,400 mcg by mouth daily.     [provider]    Allergies    Codeine, Demerol [meperidine], and Robitussin dm [dextromethorphan-guaifenesin]  Review of Systems   Review of Systems  Constitutional: Negative for chills and fever.  HENT: Negative for ear pain and sore throat.   Eyes: Negative for pain and visual disturbance.  Respiratory: Negative for cough and shortness of breath.   Cardiovascular: Negative for chest pain and palpitations.  Gastrointestinal: Negative for abdominal pain and vomiting.  Genitourinary: Negative for dysuria and hematuria.  Musculoskeletal: Positive for arthralgias. Negative for back pain.  Skin: Negative for color change and rash.  Neurological: Negative for seizures and syncope.  All other systems reviewed and are negative.   Physical Exam Updated Vital Signs BP (!) 160/74 (BP Location: Right Arm)   Pulse 68   Temp 98.9 F (37.2 C) (Oral)   Resp 16   Ht 5\' 2"  (1.575 m)    Wt 70.8 kg   SpO2 100%   BMI 28.53 kg/m   Physical Exam Vitals and nursing note reviewed.  Constitutional:      General: She is not in acute distress.    Appearance: She is well-developed.  HENT:     Head: Normocephalic and atraumatic.  Eyes:     Conjunctiva/sclera: Conjunctivae normal.  Cardiovascular:     Rate and Rhythm: Normal rate and regular rhythm.     Heart sounds: No murmur heard.   Pulmonary:  Effort: Pulmonary effort is normal. No respiratory distress.     Breath sounds: Normal breath sounds.  Abdominal:     Palpations: Abdomen is soft.     Tenderness: There is no abdominal tenderness.  Musculoskeletal:     Cervical back: Neck supple.     Comments: Back: no C, T, L spine TTP, no step off or deformity RUE: no TTP throughout, no deformity, normal joint ROM, radial pulse intact, distal sensation and motor intact LUE: no TTP throughout, no deformity, normal joint ROM, radial pulse intact, distal sensation and motor intact RLE:  no TTP throughout, no deformity, normal joint ROM, distal pulse, sensation and motor intact LLE: TTP over lateral distal knee/upper lower leg, TTP over lateral hip, pelvis, no deformity or ecchymosis, normal joint ROM, distal pulse, sensation and motor intact  Skin:    General: Skin is warm and dry.  Neurological:     Mental Status: She is alert.     ED Results / Procedures / Treatments   Labs (all labs ordered are listed, but only abnormal results are displayed) Labs Reviewed - No data to display  EKG None  Radiology DG Pelvis 1-2 Views  Result Date: 01/23/2020 CLINICAL DATA:  Fall, left hip pain EXAM: PELVIS - 1-2 VIEW COMPARISON:  None. FINDINGS: Symmetric degenerative changes in the hips with joint space narrowing and spurring. SI joints symmetric and unremarkable. No acute bony abnormality. Specifically, no fracture, subluxation, or dislocation. IMPRESSION: Mild symmetric degenerative changes in the hips. No acute bony  abnormality. Electronically Signed   By: Charlett NoseKevin  Dover M.D.   On: 01/23/2020 15:27   DG Tibia/Fibula Left  Result Date: 01/23/2020 CLINICAL DATA:  Fall, leg pain EXAM: LEFT TIBIA AND FIBULA - 2 VIEW COMPARISON:  None. FINDINGS: There is no evidence of fracture or other focal bone lesions. Soft tissues are unremarkable. IMPRESSION: Negative. Electronically Signed   By: Charlett NoseKevin  Dover M.D.   On: 01/23/2020 15:27   DG Femur Min 2 Views Left  Result Date: 01/23/2020 CLINICAL DATA:  Fall, leg pain EXAM: LEFT FEMUR 2 VIEWS COMPARISON:  None. FINDINGS: Early degenerative changes in the left hip. Knee joint spaces maintained. No joint effusion. No acute bony abnormality. Specifically, no fracture, subluxation, or dislocation. IMPRESSION: No acute bony abnormality. Electronically Signed   By: Charlett NoseKevin  Dover M.D.   On: 01/23/2020 15:28    Procedures Procedures (including critical care time)  Medications Ordered in ED Medications - No data to display  ED Course  I have reviewed the triage vital signs and the nursing notes.  Pertinent labs & imaging results that were available during my care of the patient were reviewed by me and considered in my medical decision making (see chart for details).    MDM Rules/Calculators/A&P                          74 year old lady who presents to ER after a fall.  On physical exam, she had mild tenderness over her left lower knee/upper lower leg as well as tenderness over her left hip, left pelvis.  There is no ecchymosis or deformity appreciated, plain films were normal.  Patient is able to ambulate.  Doubt occult fracture.  Pain is currently well controlled with no pain medication, discharged home.  Final Clinical Impression(s) / ED Diagnoses Final diagnoses:  Knee strain, left, initial encounter  Fall, initial encounter    Rx / DC Orders ED Discharge Orders    None  Milagros Loll, MD 01/24/20 [phone]

## 2020-01-23 NOTE — Discharge Instructions (Addendum)
Recommend rest, ice, elevation as well as Tylenol as needed.  Please schedule follow-up appointment with your orthopedic specialist for a recheck later this week.  Return to ER for reassessment if you have worsening pain, inability to ambulate or other concerning symptom.

## 2020-01-23 NOTE — ED Triage Notes (Signed)
She was standing on a stool and fell 2' to the ground. Pain in her left hip, upper and lower leg.

## 2020-01-31 ENCOUNTER — Other Ambulatory Visit: Payer: Self-pay | Admitting: Orthopaedic Surgery

## 2020-01-31 DIAGNOSIS — G8929 Other chronic pain: Secondary | ICD-10-CM

## 2020-01-31 DIAGNOSIS — M25561 Pain in right knee: Secondary | ICD-10-CM

## 2020-02-06 ENCOUNTER — Ambulatory Visit: Payer: Medicare PPO | Admitting: Physician Assistant

## 2020-02-06 ENCOUNTER — Encounter: Payer: Self-pay | Admitting: Physician Assistant

## 2020-02-06 DIAGNOSIS — M25562 Pain in left knee: Secondary | ICD-10-CM | POA: Diagnosis not present

## 2020-02-06 NOTE — Progress Notes (Signed)
Office Visit Note   Patient: [name]           Date of Birth: 1945/11/02           MRN: [id number] Visit Date: 02/06/2020              Requested by: Soundra Pilon, FNP [address] [place],  [place] [address] PCP: Soundra Pilon, FNP   Assessment & Plan: Visit Diagnoses:  1. Acute pain of left knee     Plan: Due to the fact patient is having no mechanical symptoms of the left knee at this point time given the radiographs with no acute findings recommend quad strengthening.  Follow-up as needed.  Questions were encouraged and answered at length.  Follow-Up Instructions: Return if symptoms worsen or fail to improve.   Orders:  No orders of the defined types were placed in this encounter.  No orders of the defined types were placed in this encounter.     Procedures: No procedures performed   Clinical Data: No additional findings.   Subjective: Chief Complaint  Patient presents with  . Left Leg - Pain    HPI [name] is well-known to our department service comes in today for new complaint.  On 01/23/2020 she fell off of a chair while she was standing on the chair to remove some things from her ceiling fan.  She had pain in the left knee and left leg and was seen in the ER where radiographs were obtained left femur pelvis and left tib-fib.  There were no acute fractures or acute findings.  Some mild degenerative changes involving the left hip. Patient states that overall she is able to bear weight on the leg now.  She was having some giving way sensation of the knee initially but this is resolved over the last few days.  She does feel some weakness but no giving way of the knee at this point time.  Achy pain in the knee at night only.  She is taking Tylenol for pain.  She also has undergone right knee arthroscopy back 01/06/2019 and in the right knee is doing well. Review of Systems See HPI otherwise negative  Objective: Vital Signs: There were no vitals taken  for this visit.  Physical Exam Constitutional:      Appearance: She is not ill-appearing or diaphoretic.  Pulmonary:     Effort: Pulmonary effort is normal.  Neurological:     Mental Status: She is alert and oriented to person, place, and time.  Psychiatric:        Mood and Affect: Mood normal.     Ortho Exam Right knee excellent range of motion without pain.  No abnormal warmth erythema or effusion port sites well-healed.  Left knee obvious valgus deformity.  Patellofemoral crepitus with range of motion full range of motion of the knee.  Tenderness along medial lateral joint lines.  No instability valgus varus stressing.  Left hip good range of motion without pain. Specialty Comments:  No specialty comments available.  Imaging: No results found.   PMFS History: Patient Active Problem List   Diagnosis Date Noted  . 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 Pain in the chest   . Chest pain 03/08/2016  . Diverticulitis of colon 03/08/2016  . IBS (irritable bowel syndrome) 03/08/2016   Past Medical History:  Diagnosis Date  . Arthritis   . Depression   . Diverticulitis   . 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    . IBS (irritable bowel syndrome)   . Other and unspecified hyperlipidemia   . Palpitations   . Pre-diabetes   . Vertigo     Family History  Problem Relation Age of Onset  . Heart disease Mother   . Uterine cancer Mother   . Diabetes Mother   . Hypertension Father   . Prostate cancer Father   . Hypertension Sister     Past Surgical History:  Procedure Laterality Date  . ANTERIOR AND POSTERIOR REPAIR    . CATARACT EXTRACTION  1998, 2003   bilateral  . KNEE ARTHROSCOPY Right 01/06/2019   Procedure: RIGHT KNEE ARTHROSCOPY WITH DEBRIDEMENT AND PARTIAL LATERAL MENISCECTOMY;  Surgeon: Kathryne Hitch, MD;  Location: [facility] OR;  Service: Orthopedics;  Laterality:  Right;  . LAPAROSCOPIC CHOLECYSTECTOMY  2006  . TONSILLECTOMY     and adenoids  . TOTAL ABDOMINAL HYSTERECTOMY  1985  . TUBAL LIGATION     Social History   Occupational History  . Not on file  Tobacco Use  . Smoking status: Never Smoker  . Smokeless tobacco: Never Used  Vaping Use  . Vaping Use: Never used  Substance and Sexual Activity  . Alcohol use: No  . Drug use: No  . Sexual activity: Not on file

## 2020-02-26 ENCOUNTER — Emergency Department (HOSPITAL_BASED_OUTPATIENT_CLINIC_OR_DEPARTMENT_OTHER)
Admission: EM | Admit: 2020-02-26 | Discharge: 2020-02-26 | Disposition: A | Discharge status: Home / Self Care | Payer: Medicare PPO | Attending: Emergency Medicine | Admitting: Emergency Medicine

## 2020-02-26 ENCOUNTER — Emergency Department (HOSPITAL_BASED_OUTPATIENT_CLINIC_OR_DEPARTMENT_OTHER): Payer: Medicare PPO

## 2020-02-26 ENCOUNTER — Encounter (HOSPITAL_BASED_OUTPATIENT_CLINIC_OR_DEPARTMENT_OTHER): Payer: Self-pay | Admitting: Emergency Medicine

## 2020-02-26 ENCOUNTER — Other Ambulatory Visit: Payer: Self-pay

## 2020-02-26 DIAGNOSIS — S76011A Strain of muscle, fascia and tendon of right hip, initial encounter: Secondary | ICD-10-CM

## 2020-02-26 DIAGNOSIS — Z7982 Long term (current) use of aspirin: Secondary | ICD-10-CM | POA: Diagnosis not present

## 2020-02-26 DIAGNOSIS — Z7901 Long term (current) use of anticoagulants: Secondary | ICD-10-CM | POA: Diagnosis not present

## 2020-02-26 DIAGNOSIS — M7918 Myalgia, other site: Secondary | ICD-10-CM | POA: Insufficient documentation

## 2020-02-26 DIAGNOSIS — Y998 Other external cause status: Secondary | ICD-10-CM | POA: Diagnosis not present

## 2020-02-26 DIAGNOSIS — Y9289 Other specified places as the place of occurrence of the external cause: Secondary | ICD-10-CM | POA: Diagnosis not present

## 2020-02-26 DIAGNOSIS — Z79899 Other long term (current) drug therapy: Secondary | ICD-10-CM | POA: Diagnosis not present

## 2020-02-26 DIAGNOSIS — W108XXA Fall (on) (from) other stairs and steps, initial encounter: Secondary | ICD-10-CM | POA: Insufficient documentation

## 2020-02-26 DIAGNOSIS — Y9389 Activity, other specified: Secondary | ICD-10-CM | POA: Insufficient documentation

## 2020-02-26 DIAGNOSIS — S79911A Unspecified injury of right hip, initial encounter: Secondary | ICD-10-CM | POA: Diagnosis present

## 2020-02-26 MED ORDER — ACETAMINOPHEN 500 MG PO TABS: 1000.0000 mg | ORAL_TABLET | Freq: Once | ORAL | Status: DC

## 2020-02-26 MED ORDER — OXYCODONE HCL 5 MG PO TABS: 2.5000 mg | ORAL_TABLET | Freq: Once | ORAL | Status: DC

## 2020-02-26 MED ORDER — DICLOFENAC SODIUM 1 % EX GEL: 4.0000 g | Freq: Four times a day (QID) | CUTANEOUS | 0 refills | Status: DC

## 2020-02-26 NOTE — ED Triage Notes (Signed)
Pt arrives POV with daughter c/o right side hip pain after fall last night. Pt endorses falling backward, landing on right side. Denies loc, denies hitting head, ambulatory with cane assistance

## 2020-02-26 NOTE — ED Provider Notes (Signed)
[facility] EMERGENCY DEPARTMENT Provider Note   CSN: [id number] Arrival date & time: 02/26/20  0759     History Chief Complaint  Patient presents with  . Fall    [name] is a 74 y.o. female.  74 yo F with a chief complaints of right hip pain.  The patient was closing the front door and she misjudged the step and fell down onto her bottom.  Complaining of right buttock pain.  Pain with ambulation.  Denies head injury denies loss consciousness denies neck pain or back pain.  Denies abdominal pain.  The history is provided by the patient.  Fall This is a new problem. The current episode started yesterday. The problem occurs constantly. The problem has not changed since onset.Pertinent negatives include no chest pain, no headaches and no shortness of breath. The symptoms are aggravated by bending, twisting and walking. Nothing relieves the symptoms. She has tried nothing for the symptoms. The treatment provided no relief.       Past Medical History:  Diagnosis Date  . Arthritis   . Depression   . Diverticulitis   . 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    . IBS (irritable bowel syndrome)   . Other and unspecified hyperlipidemia   . Palpitations   . Pre-diabetes   . Vertigo     Patient Active Problem List   Diagnosis Date Noted  . 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 Pain in the chest   . Chest pain 03/08/2016  . Diverticulitis of colon 03/08/2016  . IBS (irritable bowel syndrome) 03/08/2016    Past Surgical History:  Procedure Laterality Date  . ANTERIOR AND POSTERIOR REPAIR    . CATARACT EXTRACTION  1998, 2003   bilateral  . KNEE ARTHROSCOPY Right 01/06/2019   Procedure: RIGHT KNEE ARTHROSCOPY WITH DEBRIDEMENT AND PARTIAL LATERAL MENISCECTOMY;  Surgeon: Kathryne Hitch, MD;  Location: [facility] OR;  Service: Orthopedics;  Laterality: Right;  .  LAPAROSCOPIC CHOLECYSTECTOMY  2006  . TONSILLECTOMY     and adenoids  . TOTAL ABDOMINAL HYSTERECTOMY  1985  . TUBAL LIGATION       OB History   No obstetric history on file.     Family History  Problem Relation Age of Onset  . Heart disease Mother   . Uterine cancer Mother   . Diabetes Mother   . Hypertension Father   . Prostate cancer Father   . Hypertension Sister     Social History   Tobacco Use  . Smoking status: Never Smoker  . Smokeless tobacco: Never Used  Vaping Use  . Vaping Use: Never used  Substance Use Topics  . Alcohol use: No  . Drug use: No    Home Medications Prior to Admission medications   Medication Sig Start Date End Date Taking? Authorizing Provider  acetaminophen (TYLENOL) 500 MG tablet Take 1,000 mg by mouth every 6 (six) hours as needed for moderate pain.    [provider]  ALPRAZolam Prudy Feeler) 0.5 MG tablet Take 0.5 mg by mouth 3 (three) times daily as needed for anxiety.  12/07/18   [provider]  Ascorbic Acid (VITAMIN C) 1000 MG tablet Take 1,000 mg by mouth every evening.     [provider]  aspirin EC 81 MG tablet Take 81 mg by mouth every evening.     [provider]  B Complex Vitamins (B COMPLEX-B12) TABS Take  1 tablet by mouth daily.    [provider]  Biotin (BIOTIN 5000) 5 MG CAPS Take 5 mg by mouth daily.    [provider]  Calcium Carbonate-Vit D-Min (CALCIUM 1200 PO) Take 1,200 mg by mouth every evening.    [provider]  Cholecalciferol (VITAMIN D) 2000 units CAPS Take 2,000 Units by mouth every evening.     [provider]  Coenzyme Q10 (COQ10) 200 MG CAPS Take 200 mg by mouth daily.     [provider]  diclofenac Sodium (VOLTAREN) 1 % GEL Apply 4 g topically 4 (four) times daily. 02/26/20   Melene Plan, DO  Fish Oil-Krill Oil (KRILL OIL PLUS) CAPS Take 500 mg by mouth daily.     [provider]  hyoscyamine (LEVSIN SL) 0.125 MG SL  tablet Take 0.125 mg by mouth every 4 (four) hours as needed for pain. 02/29/16   [provider]  loratadine (CLARITIN) 10 MG tablet Take 10 mg by mouth every evening.    [provider]  MAGNESIUM PO Take 500 mg by mouth every evening.     [provider]  Multiple Vitamin (MULTIVITAMIN WITH MINERALS) TABS tablet Take 1 tablet by mouth daily.    [provider]  ondansetron (ZOFRAN ODT) 4 MG disintegrating tablet Take 1 tablet (4 mg total) by mouth every 8 (eight) hours as needed for nausea or vomiting. 01/06/19   Kathryne Hitch, MD  pantoprazole (PROTONIX) 40 MG tablet Take 1 tablet (40 mg total) by mouth daily. 03/09/16   Ghimire, Werner Lean, MD  PARoxetine (PAXIL) 20 MG tablet Take 10 mg by mouth every morning.     [provider]  Potassium 99 MG TABS Take 99 mg by mouth every evening.     [provider]  Probiotic Product (PROBIOTIC DAILY PO) Take 1 capsule by mouth daily.    [provider]  rivaroxaban (XARELTO) 10 MG TABS tablet Take 1 tablet (10 mg total) by mouth daily. 01/06/19   Kathryne Hitch, MD  simvastatin (ZOCOR) 10 MG tablet Take 10 mg by mouth every evening.  11/17/18   [provider]  Vitamin A 2400 MCG (8000 UT) TABS Take 2,400 mcg by mouth daily.    [provider]  VITAMIN E PO Take 2,400 mcg by mouth daily.     [provider]    Allergies    Codeine, Demerol [meperidine], and Robitussin dm [dextromethorphan-guaifenesin]  Review of Systems   Review of Systems  Constitutional: Negative for chills and fever.  HENT: Negative for congestion and rhinorrhea.   Eyes: Negative for redness and visual disturbance.  Respiratory: Negative for shortness of breath and wheezing.   Cardiovascular: Negative for chest pain and palpitations.  Gastrointestinal: Negative for nausea and vomiting.  Genitourinary: Negative for dysuria and urgency.  Musculoskeletal: Positive for  arthralgias and myalgias.  Skin: Negative for pallor and wound.  Neurological: Negative for dizziness and headaches.    Physical Exam Updated Vital Signs BP (!) 175/79 (BP Location: Right Arm)   Pulse 84   Temp 97.9 F (36.6 C) (Oral)   Resp 18   Ht 5\' 2"  (1.575 m)   Wt 70.3 kg   SpO2 96%   BMI 28.35 kg/m   Physical Exam Vitals and nursing note reviewed.  Constitutional:      General: She is not in acute distress.    Appearance: She is well-developed. She is not diaphoretic.  HENT:  Head: Normocephalic and atraumatic.  Eyes:     Pupils: Pupils are equal, round, and reactive to light.  Cardiovascular:     Rate and Rhythm: Normal rate and regular rhythm.     Heart sounds: No murmur heard.  No friction rub. No gallop.   Pulmonary:     Effort: Pulmonary effort is normal.     Breath sounds: No wheezing or rales.  Abdominal:     General: There is no distension.     Palpations: Abdomen is soft.     Tenderness: There is no abdominal tenderness.  Musculoskeletal:        General: No tenderness.     Cervical back: Normal range of motion and neck supple.  Skin:    General: Skin is warm and dry.  Neurological:     Mental Status: She is alert and oriented to person, place, and time.  Psychiatric:        Behavior: Behavior normal.     ED Results / Procedures / Treatments   Labs (all labs ordered are listed, but only abnormal results are displayed) Labs Reviewed - No data to display  EKG None  Radiology DG Hip Unilat W or Wo Pelvis 2-3 Views Right  Result Date: 02/26/2020 CLINICAL DATA:  Pain following fall EXAM: DG HIP (WITH OR WITHOUT PELVIS) 2-3V RIGHT COMPARISON:  None. FINDINGS: Frontal pelvis as well as frontal and lateral right hip images were obtained. No fracture or dislocation. There is relatively mild narrowing of the right hip joint. Left hip joint appears unremarkable. No erosive change. Sacroiliac joints appear unremarkable. 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 No fracture or dislocation. Electronically Signed   By: Bretta BangWilliam  Woodruff III M.D.   On: 02/26/2020 09:11    Procedures Procedures (including critical care time)  Medications Ordered in ED Medications - No data to display  ED Course  I have reviewed the triage vital signs and the nursing notes.  Pertinent labs & imaging results that were available during my care of the patient were reviewed by me and considered in my medical decision making (see chart for details).    MDM Rules/Calculators/A&P                          74 yo F with a cc of right buttock pain after a fall.  She has been ambulating.  Plain film viewed by me without fracture.  PCP follow-up.  9:17 AM:  I have discussed the diagnosis/risks/treatment options with the patient and believe the pt to be eligible for discharge home to follow-up with PCP. We also discussed returning to the ED immediately if new or worsening sx occur. We discussed the sx which are most concerning (e.g., sudden worsening pain, fever, inability to tolerate by mouth) that necessitate immediate return. Medications administered to the patient during their visit and any new prescriptions provided to the patient are listed below.  Medications given during this visit Medications - No data to display   The patient appears reasonably screen and/or stabilized for discharge and I doubt any other medical condition or other [facility] requiring further screening, evaluation, or treatment in the ED at this time prior to discharge.   Final Clinical Impression(s) / ED Diagnoses Final diagnoses:  Hip strain, right, initial encounter    Rx / DC Orders ED Discharge Orders         Ordered    diclofenac Sodium (VOLTAREN)  1 % GEL  4 times daily        02/26/20 0914           Melene Plan, DO 02/26/20 [phone]

## 2020-02-26 NOTE — Discharge Instructions (Signed)
Take tylenol 1000mg (2 extra strength) four times a day. Use the gel as prescribed.  Return for worsening symptoms.

## 2020-02-26 NOTE — ED Notes (Signed)
Patient transported to X-ray 

## 2020-10-19 DIAGNOSIS — H1013 Acute atopic conjunctivitis, bilateral: Secondary | ICD-10-CM | POA: Diagnosis not present

## 2020-12-26 DIAGNOSIS — D6852 Prothrombin gene mutation: Secondary | ICD-10-CM | POA: Diagnosis not present

## 2020-12-26 DIAGNOSIS — R413 Other amnesia: Secondary | ICD-10-CM | POA: Diagnosis not present

## 2021-01-03 DIAGNOSIS — M8588 Other specified disorders of bone density and structure, other site: Secondary | ICD-10-CM | POA: Diagnosis not present

## 2021-01-03 DIAGNOSIS — N958 Other specified menopausal and perimenopausal disorders: Secondary | ICD-10-CM | POA: Diagnosis not present

## 2021-01-03 DIAGNOSIS — Z1231 Encounter for screening mammogram for malignant neoplasm of breast: Secondary | ICD-10-CM | POA: Diagnosis not present

## 2021-01-03 DIAGNOSIS — Z6830 Body mass index (BMI) 30.0-30.9, adult: Secondary | ICD-10-CM | POA: Diagnosis not present

## 2021-01-03 DIAGNOSIS — Z01419 Encounter for gynecological examination (general) (routine) without abnormal findings: Secondary | ICD-10-CM | POA: Diagnosis not present

## 2021-01-22 DIAGNOSIS — D225 Melanocytic nevi of trunk: Secondary | ICD-10-CM | POA: Diagnosis not present

## 2021-01-22 DIAGNOSIS — L821 Other seborrheic keratosis: Secondary | ICD-10-CM | POA: Diagnosis not present

## 2021-01-22 DIAGNOSIS — D2271 Melanocytic nevi of right lower limb, including hip: Secondary | ICD-10-CM | POA: Diagnosis not present

## 2021-06-05 DIAGNOSIS — E119 Type 2 diabetes mellitus without complications: Secondary | ICD-10-CM | POA: Diagnosis not present

## 2021-07-19 DIAGNOSIS — F339 Major depressive disorder, recurrent, unspecified: Secondary | ICD-10-CM | POA: Diagnosis not present

## 2021-07-19 DIAGNOSIS — L659 Nonscarring hair loss, unspecified: Secondary | ICD-10-CM | POA: Diagnosis not present

## 2021-07-19 DIAGNOSIS — R7303 Prediabetes: Secondary | ICD-10-CM | POA: Diagnosis not present

## 2021-07-19 DIAGNOSIS — F419 Anxiety disorder, unspecified: Secondary | ICD-10-CM | POA: Diagnosis not present

## 2021-07-19 DIAGNOSIS — Z0000 Encounter for general adult medical examination without abnormal findings: Secondary | ICD-10-CM | POA: Diagnosis not present

## 2021-07-19 DIAGNOSIS — R6889 Other general symptoms and signs: Secondary | ICD-10-CM | POA: Diagnosis not present

## 2021-07-19 DIAGNOSIS — E782 Mixed hyperlipidemia: Secondary | ICD-10-CM | POA: Diagnosis not present

## 2021-07-19 DIAGNOSIS — D6852 Prothrombin gene mutation: Secondary | ICD-10-CM | POA: Diagnosis not present

## 2021-07-19 DIAGNOSIS — Z23 Encounter for immunization: Secondary | ICD-10-CM | POA: Diagnosis not present

## 2021-09-15 IMAGING — DX DG TIBIA/FIBULA 2V*L*
4 series · 4 of 4 positions shown · non-contrast
Comparison: None.

CLINICAL DATA: Fall, leg pain

EXAM:
LEFT TIBIA AND FIBULA - 2 VIEW

[tibia ap (1 of 2)]
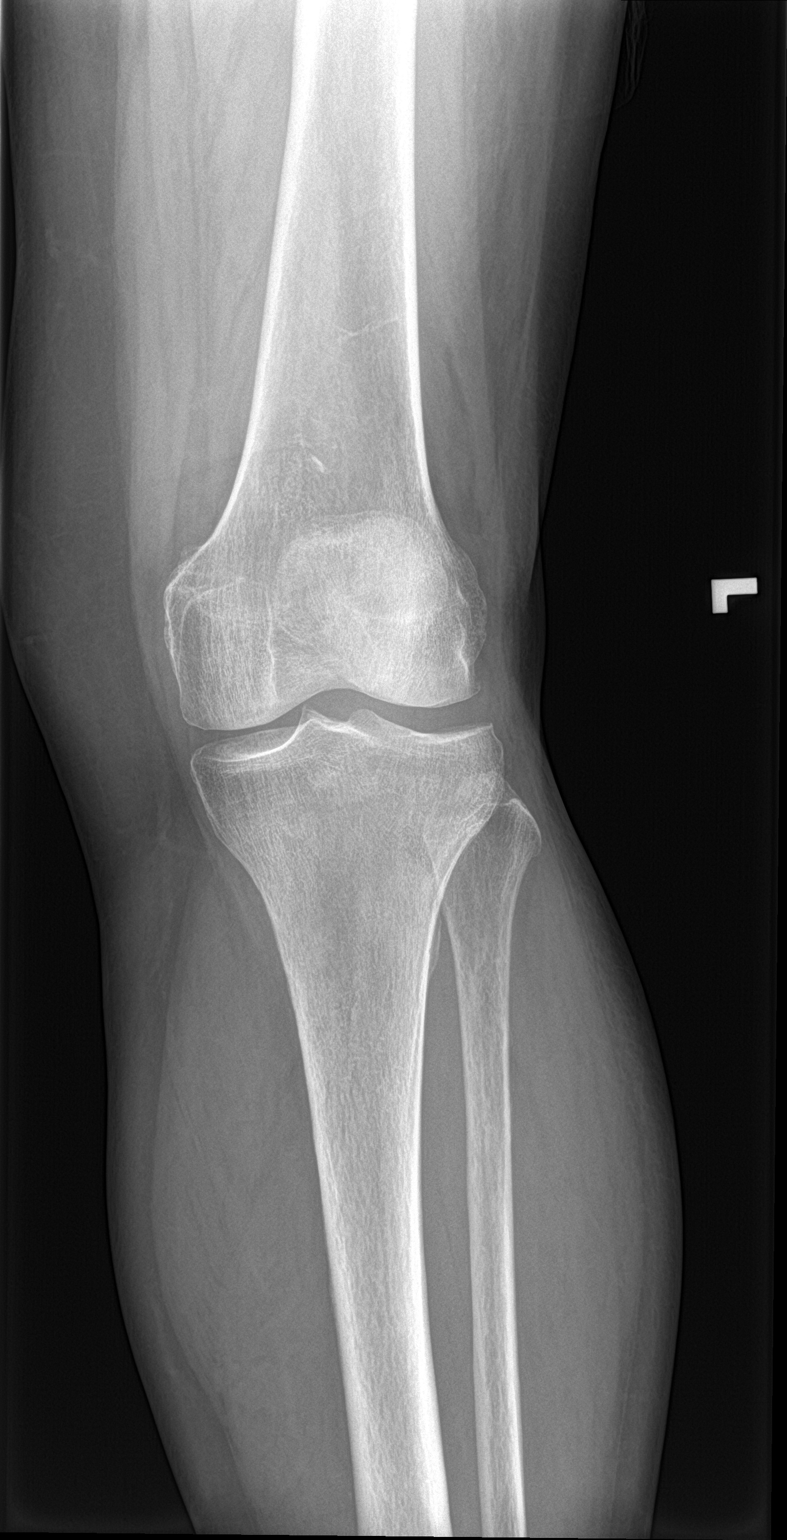

[tibia ap (2 of 2)]
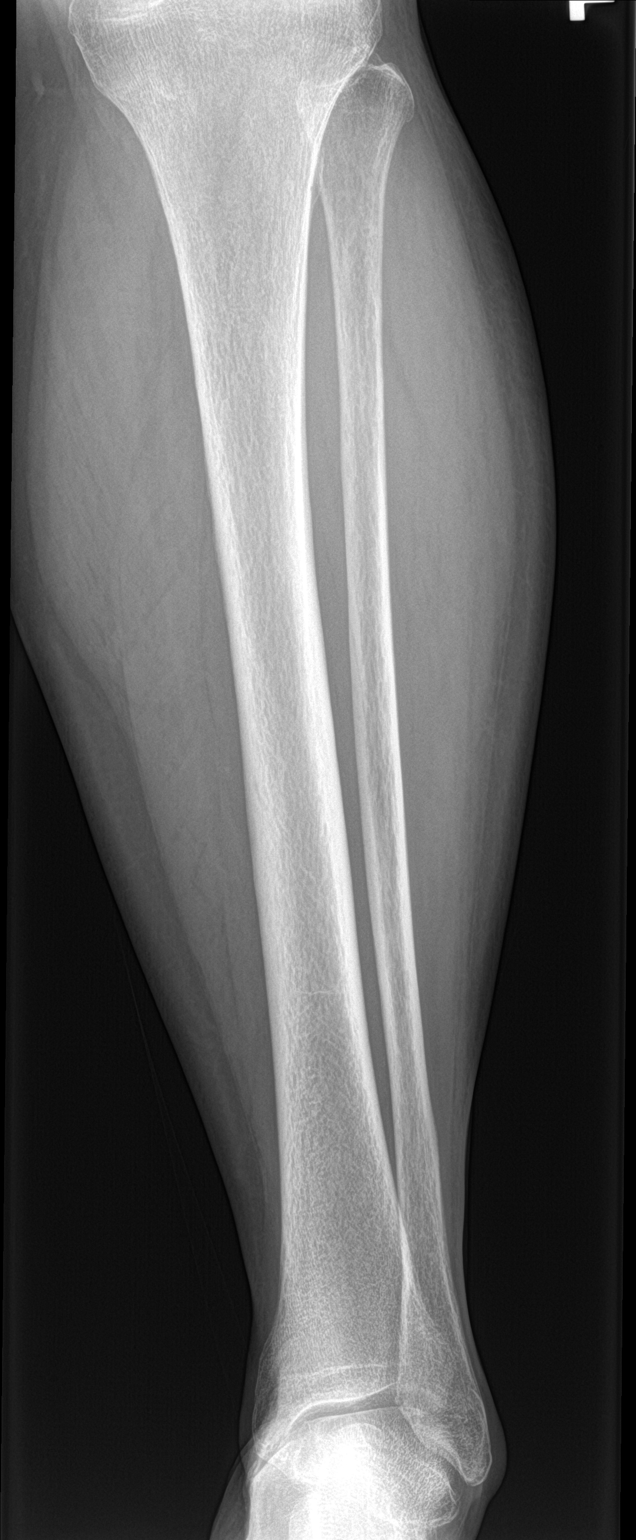

[tibia lat (1 of 2)]
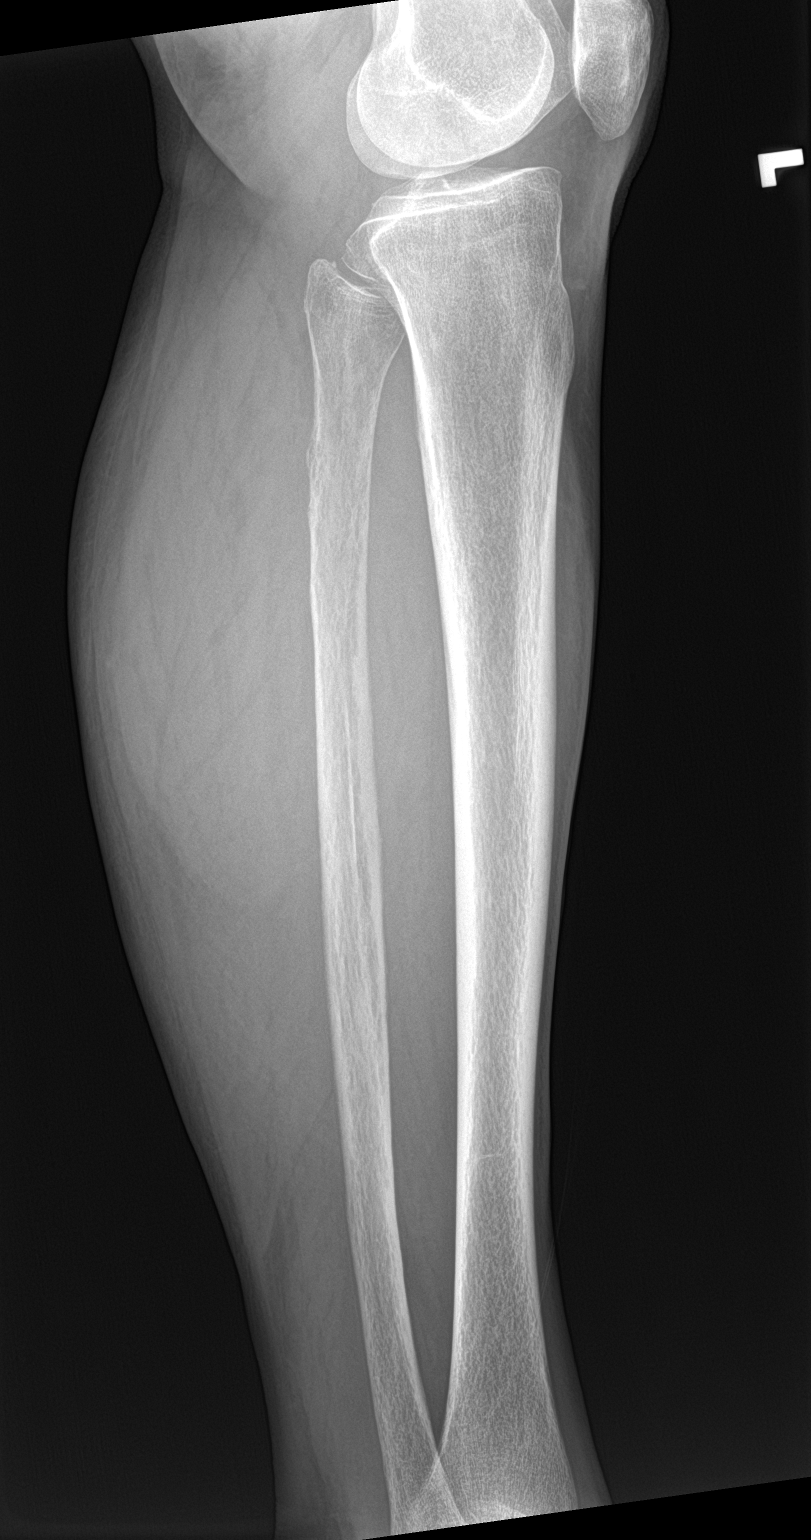

[tibia lat (2 of 2)]
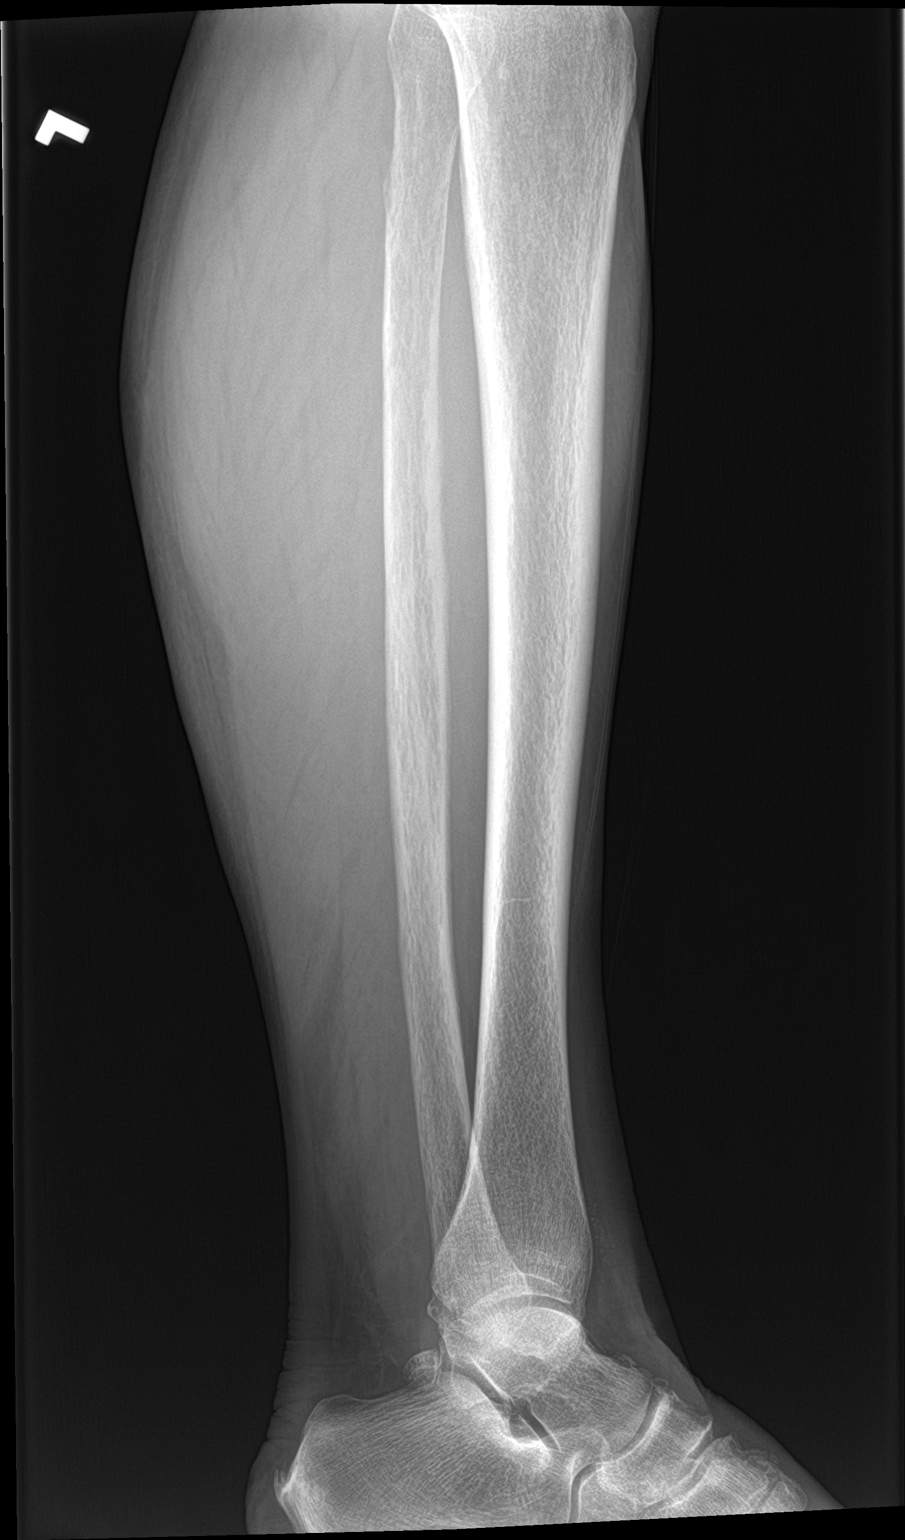

[4 of 4 positions shown; findings below may reference images not displayed]

FINDINGS: There is no evidence of fracture or other focal bone lesions. Soft
tissues are unremarkable.
IMPRESSION: Negative.

## 2021-12-24 DIAGNOSIS — K1379 Other lesions of oral mucosa: Secondary | ICD-10-CM | POA: Diagnosis not present

## 2022-01-07 DIAGNOSIS — Z124 Encounter for screening for malignant neoplasm of cervix: Secondary | ICD-10-CM | POA: Diagnosis not present

## 2022-01-07 DIAGNOSIS — Z1231 Encounter for screening mammogram for malignant neoplasm of breast: Secondary | ICD-10-CM | POA: Diagnosis not present

## 2022-01-07 DIAGNOSIS — Z6830 Body mass index (BMI) 30.0-30.9, adult: Secondary | ICD-10-CM | POA: Diagnosis not present

## 2022-01-07 DIAGNOSIS — Z1272 Encounter for screening for malignant neoplasm of vagina: Secondary | ICD-10-CM | POA: Diagnosis not present

## 2022-01-20 DIAGNOSIS — H903 Sensorineural hearing loss, bilateral: Secondary | ICD-10-CM | POA: Diagnosis not present

## 2022-01-20 DIAGNOSIS — H9190 Unspecified hearing loss, unspecified ear: Secondary | ICD-10-CM | POA: Diagnosis not present

## 2022-01-20 DIAGNOSIS — K1379 Other lesions of oral mucosa: Secondary | ICD-10-CM | POA: Diagnosis not present

## 2022-01-28 DIAGNOSIS — L820 Inflamed seborrheic keratosis: Secondary | ICD-10-CM | POA: Diagnosis not present

## 2022-01-28 DIAGNOSIS — D229 Melanocytic nevi, unspecified: Secondary | ICD-10-CM | POA: Diagnosis not present

## 2022-01-28 DIAGNOSIS — L821 Other seborrheic keratosis: Secondary | ICD-10-CM | POA: Diagnosis not present

## 2022-01-28 DIAGNOSIS — Z86018 Personal history of other benign neoplasm: Secondary | ICD-10-CM | POA: Diagnosis not present

## 2022-01-28 DIAGNOSIS — L814 Other melanin hyperpigmentation: Secondary | ICD-10-CM | POA: Diagnosis not present

## 2022-01-28 DIAGNOSIS — L578 Other skin changes due to chronic exposure to nonionizing radiation: Secondary | ICD-10-CM | POA: Diagnosis not present

## 2022-02-11 ENCOUNTER — Encounter: Payer: Self-pay | Admitting: Cardiology

## 2022-02-11 ENCOUNTER — Ambulatory Visit: Payer: Medicare PPO | Attending: Cardiology | Admitting: Cardiology

## 2022-02-11 VITALS — BP 132/68 | HR 85 | Ht 62.0 in | Wt 168.2 lb

## 2022-02-11 DIAGNOSIS — R002 Palpitations: Secondary | ICD-10-CM | POA: Diagnosis not present

## 2022-02-11 NOTE — Addendum Note (Signed)
Addended by: Theresia Majors on: 02/11/2022 08:16 AM   Modules accepted: Orders

## 2022-02-11 NOTE — Patient Instructions (Signed)
Medication Instructions:  Your physician recommends that you continue on your current medications as directed. Please refer to the Current Medication list given to you today.  *If you need a refill on your cardiac medications before your next appointment, please call your pharmacy*   Testing/Procedures: Your physician has recommended that you wear an event monitor. Event monitors are medical devices that record the heart's electrical activity. Doctors most often [place] these monitors to diagnose arrhythmias. Arrhythmias are problems with the speed or rhythm of the heartbeat. The monitor is a small, portable device. You can wear one while you do your normal daily activities. This is usually used to diagnose what is causing palpitations/syncope (passing out).   Follow-Up: At [facility], you and your health needs are our priority.  As part of our continuing mission to provide you with exceptional heart care, we have created designated Provider Care Teams.  These Care Teams include your primary Cardiologist (physician) and Advanced Practice Providers (APPs -  Physician Assistants and Nurse Practitioners) who all work together to provide you with the care you need, when you need it.  Follow up with Dr. Mayford Knife as needed based on results of testing.   Other Instructions ZIO XT- Long Term Monitor Instructions  Your physician has requested you wear a ZIO patch monitor for 14 days.  This is a single patch monitor. [employer] supplies one patch monitor per enrollment. Additional stickers are not available. Please do not apply patch if you will be having a Nuclear Stress Test,  Echocardiogram, Cardiac CT, MRI, or Chest Xray during the period you would be wearing the  monitor. The patch cannot be worn during these tests. You cannot remove and re-apply the  ZIO XT patch monitor.  Your ZIO patch monitor will be mailed 3 day USPS to your address on file. It may take 3-5 days  to receive your monitor  after you have been enrolled.  Once you have received your monitor, please review the enclosed instructions. Your monitor  has already been registered assigning a specific monitor serial # to you.  Billing and Patient Assistance Program Information  We have supplied [employer] with any of your insurance information on file for billing purposes. [employer] offers a sliding scale Patient Assistance Program for patients that do not have  insurance, or whose insurance does not completely cover the cost of the ZIO monitor.  You must apply for the Patient Assistance Program to qualify for this discounted rate.  To apply, please call [employer] at [phone], select option 4, select option 2, ask to apply for  Patient Assistance Program. Meredeth Ide will ask your household income, and how many people  are in your household. They will quote your out-of-pocket cost based on that information.  [employer] will also be able to set up a 68-month, interest-free payment plan if needed.  Applying the monitor   Shave hair from upper left chest.  Hold abrader disc by orange tab. Rub abrader in 40 strokes over the upper left chest as  indicated in your monitor instructions.  Clean area with 4 enclosed alcohol pads. Let dry.  Apply patch as indicated in monitor instructions. Patch will be placed under collarbone on left  side of chest with arrow pointing upward.  Rub patch adhesive wings for 2 minutes. Remove white label marked "1". Remove the white  label marked "2". Rub patch adhesive wings for 2 additional minutes.  While looking in a mirror, press and release button in center of patch. A  small green light will  flash 3-4 times. This will be your only indicator that the monitor has been turned on.  Do not shower for the first 24 hours. You may shower after the first 24 hours.  Press the button if you feel a symptom. You will hear a small click. Record Date, Time and  Symptom in the Patient Logbook.  When you are  ready to remove the patch, follow instructions on the last 2 pages of Patient  Logbook. Stick patch monitor onto the last page of Patient Logbook.  Place Patient Logbook in the blue and white box. Use locking tab on box and tape box closed  securely. The blue and white box has prepaid postage on it. Please place it in the mailbox as  soon as possible. Your physician should have your test results approximately 7 days after the  monitor has been mailed back to [facility]  Call [facility] Customer Care at [phone] if you have questions regarding  your ZIO XT patch monitor. Call them immediately if you see an orange light blinking on your  monitor.  If your monitor falls off in less than 4 days, contact our Monitor department at [phone].  If your monitor becomes loose or falls off after 4 days call [employer] at [phone] for  suggestions on securing your monitor   Important Information About Sugar

## 2022-02-11 NOTE — Progress Notes (Signed)
Cardiology CONSULT Note    Date:  02/11/2022   ID:  [name], DOB [birth year], MRN [id number]  PCP:  Soundra Pilon, FNP  Cardiologist:  [name], MD   Chief Complaint  Patient presents with   New Patient (Initial Visit)    Palpitations    History of Present Illness:  [name] is a 76 y.o. female who is being seen today for the evaluation of palpitations at the request of Mitchel Honour, DO.  This is a 76 year old female with a history of hereditary thrombophilia, IBS, prediabetes, depression, vertigo who recently was seen by her GYN for physical exam and complained of palpitations.  She was seen by cardiology back in 2017 for epigastric pain.  2D echo showed Normal LV function with EF 60 to 65% and grade 1 diastolic dysfunction.  Nuclear stress test showed no ischemia.  She does have a family history of her mother needing a pacemaker but no history of CAD.  She tells me that she had a spell back in July where her heart was pounding and her apple watch said that her heart rate was in the 110bpm range.  Her palpitations are sporadic.  She has awakened in the middle of the night with palpitations as well.  She has had them for years usually when she was rushing on Sundays to get ready for [facility].  She has recently had more palpitations.  She drinks decaf coffee but drinks a lot of caffeinated ice tea and 1 soda daily.  She denies any chest pain or pressure, SOB, DOE, LE edema, dizziness or syncope.  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     IBS (irritable bowel syndrome)    Other and unspecified hyperlipidemia    Palpitations    Pre-diabetes    Vertigo     Past Surgical History:  Procedure Laterality Date   ANTERIOR AND POSTERIOR REPAIR     CATARACT EXTRACTION  1998, 2003   bilateral   KNEE  ARTHROSCOPY Right 01/06/2019   Procedure: RIGHT KNEE ARTHROSCOPY WITH DEBRIDEMENT AND PARTIAL LATERAL MENISCECTOMY;  Surgeon: Kathryne Hitch, MD;  Location: [facility] OR;  Service: Orthopedics;  Laterality: Right;   LAPAROSCOPIC CHOLECYSTECTOMY  2006   TONSILLECTOMY     and adenoids   TOTAL ABDOMINAL HYSTERECTOMY  1985   TUBAL LIGATION      Current Medications: Current Meds  Medication Sig   acetaminophen (TYLENOL) 500 MG tablet Take 1,000 mg by mouth every 6 (six) hours as needed for moderate pain.   ALPRAZolam (XANAX) 0.5 MG tablet Take 0.5 mg by mouth 3 (three) times daily as needed for anxiety.    Ascorbic Acid (VITAMIN C) 1000 MG tablet Take 1,000 mg by mouth every evening.    aspirin EC 81 MG tablet Take 81 mg by mouth every evening.    B Complex Vitamins (B COMPLEX-B12) TABS Take 1 tablet by mouth daily.   Biotin (BIOTIN 5000) 5 MG CAPS Take 5 mg by mouth daily.   Calcium Carbonate-Vit D-Min (CALCIUM 1200 PO) Take 1,200 mg by mouth every evening.   Cholecalciferol (VITAMIN D) 2000 units CAPS Take 2,000 Units by mouth every evening.    Coenzyme Q10 (COQ10) 200 MG CAPS Take 200 mg by mouth daily.    Fish Oil-Krill Oil (KRILL OIL PLUS)  CAPS Take 500 mg by mouth daily.    hyoscyamine (LEVSIN SL) 0.125 MG SL tablet Take 0.125 mg by mouth every 4 (four) hours as needed for pain.   loratadine (CLARITIN) 10 MG tablet Take 10 mg by mouth every evening.   MAGNESIUM PO Take 500 mg by mouth every evening.    Multiple Vitamin (MULTIVITAMIN WITH MINERALS) TABS tablet Take 1 tablet by mouth daily.   pantoprazole (PROTONIX) 40 MG tablet Take 1 tablet (40 mg total) by mouth daily.   PARoxetine (PAXIL) 20 MG tablet Take 10 mg by mouth every morning.    Potassium 99 MG TABS Take 99 mg by mouth every evening.    Probiotic Product (PROBIOTIC DAILY PO) Take 1 capsule by mouth daily.   simvastatin (ZOCOR) 10 MG tablet Take 10 mg by mouth every evening.    Vitamin A 2400 MCG (8000 UT) TABS Take 2,400  mcg by mouth daily.   VITAMIN E PO Take 2,400 mcg by mouth daily.     Allergies:   Codeine, Demerol [meperidine], and Robitussin dm [dextromethorphan-guaifenesin]   Social History   Socioeconomic History   Marital status: Married    Spouse name: Not on file   Number of children: Not on file   Years of education: Not on file   Highest education level: Not on file  Occupational History   Not on file  Tobacco Use   Smoking status: Never   Smokeless tobacco: Never  Vaping Use   Vaping Use: Never used  Substance and Sexual Activity   Alcohol use: No   Drug use: No   Sexual activity: Not on file  Other Topics Concern   Not on file  Social History Narrative   Not on file   Social Determinants of Health   Financial Resource Strain: Not on file  Food Insecurity: Not on file  Transportation Needs: Not on file  Physical Activity: Not on file  Stress: Not on file  Social Connections: Not on file     Family History:  The patient's family history includes Diabetes in her mother; Heart disease in her mother; Hypertension in her father and sister; Prostate cancer in her father; Uterine cancer in her mother.   ROS:   Please see the history of present illness.    ROS All other systems reviewed and are negative.      No data to display             PHYSICAL EXAM:   VS:  BP 132/68   Pulse 85   Ht 5\' 2"  (1.575 m)   Wt 168 lb 3.2 oz (76.3 kg)   SpO2 95%   BMI 30.76 kg/m    GEN: Well nourished, well developed, in no acute distress  HEENT: normal  Neck: no JVD, carotid bruits, or masses Cardiac: RRR; no murmurs, rubs, or gallops,no edema.  Intact distal pulses bilaterally.  Respiratory:  clear to auscultation bilaterally, normal work of breathing GI: soft, nontender, nondistended, + BS MS: no deformity or atrophy  Skin: warm and dry, no rash Neuro:  Alert and Oriented x 3, Strength and sensation are intact Psych: euthymic mood, full affect  Wt Readings from Last 3  Encounters:  02/11/22 168 lb 3.2 oz (76.3 kg)  02/26/20 155 lb (70.3 kg)  01/23/20 156 lb (70.8 kg)      Studies/Labs Reviewed:   EKG:  EKG is ordered today.  The ekg ordered today demonstrates NSR with nonspecific ST abnormality  Recent Labs:  No results found for requested labs within last 365 days.   Lipid Panel No results found for: "CHOL", "TRIG", "HDL", "CHOLHDL", "VLDL", "LDLCALC", "LDLDIRECT"   CHA2DS2-VASc Score =     This indicates a  % annual risk of stroke. The patient's score is based upon:             Additional studies/ records that were reviewed today include:  Office visit notes from PCP    ASSESSMENT:    1. Palpitations      PLAN:  In order of problems listed above:  Palpitations -I will get a 2 week Ziopatch to evaluate for arrhythmias  Time Spent: 20 minutes total time of encounter, including 15 minutes spent in face-to-face patient care on the date of this encounter. This time includes coordination of care and counseling regarding above mentioned problem list. Remainder of non-face-to-face time involved reviewing chart documents/testing relevant to the patient encounter and documentation in the medical record. I have independently reviewed documentation from referring provider  Medication Adjustments/Labs and Tests Ordered: Current medicines are reviewed at length with the patient today.  Concerns regarding medicines are outlined above.  Medication changes, Labs and Tests ordered today are listed in the Patient Instructions below.  There are no Patient Instructions on file for this visit.   Signed, [name], MD  02/11/2022 8:11 AM    [facility] [address] [place], [place], [place]  [address] Phone: [phone]; Fax: [phone]

## 2022-02-12 ENCOUNTER — Ambulatory Visit: Payer: Medicare PPO | Attending: Cardiology

## 2022-02-12 DIAGNOSIS — R002 Palpitations: Secondary | ICD-10-CM

## 2022-02-12 NOTE — Progress Notes (Unsigned)
Enrolled for [employer] to mail a ZIO XT long term holter monitor to the patients address on file.  

## 2022-02-13 DIAGNOSIS — R002 Palpitations: Secondary | ICD-10-CM

## 2022-03-05 DIAGNOSIS — R002 Palpitations: Secondary | ICD-10-CM | POA: Diagnosis not present

## 2022-03-06 ENCOUNTER — Telehealth: Payer: Self-pay

## 2022-03-06 ENCOUNTER — Encounter: Payer: Self-pay | Admitting: Cardiology

## 2022-03-06 DIAGNOSIS — I4719 Other supraventricular tachycardia: Secondary | ICD-10-CM | POA: Insufficient documentation

## 2022-03-06 DIAGNOSIS — I471 Supraventricular tachycardia: Secondary | ICD-10-CM | POA: Insufficient documentation

## 2022-03-06 MED ORDER — METOPROLOL SUCCINATE ER 25 MG PO TB24: 25.0000 mg | ORAL_TABLET | Freq: Every day | ORAL | 3 refills | Status: DC

## 2022-03-06 NOTE — Telephone Encounter (Signed)
The patient has been notified of the result and verbalized understanding.  All questions (if any) were answered. [name], RN 03/06/2022 10:29 AM  Prescription has been sent in. Follow up has been scheduled.

## 2022-03-06 NOTE — Telephone Encounter (Signed)
-----   Message from Sueanne Margarita, MD sent at 03/06/2022  9:11 AM EDT ----- Heart monitor showed nonsustained atrial tachycardia.  Recommend addition of Toprol-XL 25 mg daily with follow-up with PA in 4 to 6 weeks.

## 2022-04-07 NOTE — Progress Notes (Signed)
Cardiology Office Note:    Date:  04/08/2022   ID:  [name], DOB 04/22/1946, MRN [id number]  PCP:  Kristen Loader, [facility] Cardiologist:  Fransico Him, MD     Referring MD: Kristen Loader, FNP   Chief Complaint:  F/u for Atrial Tachycardia    Patient Profile: Paroxysmal atrial tachycardia Myoview 03/2016: no ischemia Echocardiogram 9/17: EF 60-65, Gr 1 DD, trivial MR Hereditary thrombophilia  IBS Pre-diabetes Depression  Vertigo   Prior CV Studies: LONG TERM MONITOR (8-14 DAYS) INTERPRETATION 03/05/2022   Predominant rhythm was normal sinus rhythm with an average heart rate of 83 bpm and ranged from 57 to 148 bpm   Nonsustained atrial tachycardia lasting as long as 9 beats with fastest heart rate 174 bpm, rare PAC, atrial couplet and atrial triplet   Rare PVC  GATED SPECT MYO PERF W/LEXISCAN STRESS 1D 03/26/2016  Nuclear stress EF: 76%.   This is a low risk study. No ischemia identified.  ECHO COMPLETE WO IMAGING ENHANCING AGENT 03/09/2016 Mild focal basal septal LVH, EF 60-65, no RWMA, Gr 1 DD, trivial MR, trivial TR   History of Present Illness:   [name] is a 76 y.o. female with the above problem list.  She was evaluated by Dr. Radford Pax for palpitations in Aug 2023. Zio monitor demonstrated 4 runs of non-sustained atrial tachycardia. She was placed on low dose metoprolol succinate. She returns for f/u.  She is here alone.  Since starting on Toprol-XL 25 mg daily, she has had improved symptoms of palpitations.  She has only had 1 episode.  She has not had chest pain, shortness breath, syncope, orthopnea, leg edema.  She would like to decrease the dose of Toprol to 12.5 mg daily if possible.        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     IBS (irritable bowel  syndrome)    Other and unspecified hyperlipidemia    Palpitations    PAT (paroxysmal atrial tachycardia)    Pre-diabetes    Vertigo    Current Medications: Current Meds  Medication Sig   acetaminophen (TYLENOL) 500 MG tablet Take 1,000 mg by mouth every 6 (six) hours as needed for moderate pain.   ALPRAZolam (XANAX) 0.5 MG tablet Take 0.5 mg by mouth 3 (three) times daily as needed for anxiety.    Ascorbic Acid (VITAMIN C) 1000 MG tablet Take 1,000 mg by mouth every evening.    aspirin EC 81 MG tablet Take 81 mg by mouth every evening.    B Complex Vitamins (B COMPLEX-B12) TABS Take 1 tablet by mouth daily.   Biotin (BIOTIN 5000) 5 MG CAPS Take 5 mg by mouth daily.   Calcium Carbonate-Vit D-Min (CALCIUM 1200 PO) Take 1,200 mg by mouth every evening.   Cholecalciferol (VITAMIN D) 2000 units CAPS Take 2,000 Units by mouth every evening.    Coenzyme Q10 (COQ10) 200 MG CAPS Take 200 mg by mouth daily.    Fish Oil-Krill Oil (KRILL OIL PLUS) CAPS Take 500 mg by mouth daily.    hyoscyamine (LEVSIN SL) 0.125 MG SL tablet Take 0.125 mg by mouth every 4 (four) hours as needed for pain.   loratadine (CLARITIN) 10 MG tablet Take 10 mg by mouth every evening.   MAGNESIUM PO Take 500  mg by mouth every evening.    metoprolol succinate (TOPROL XL) 25 MG 24 hr tablet Take 0.5 tablets (12.5 mg total) by mouth daily.   Multiple Vitamin (MULTIVITAMIN WITH MINERALS) TABS tablet Take 1 tablet by mouth daily.   PARoxetine (PAXIL) 20 MG tablet Take 10 mg by mouth every morning.    Potassium 99 MG TABS Take 99 mg by mouth every evening.    Probiotic Product (PROBIOTIC DAILY PO) Take 1 capsule by mouth daily.   simvastatin (ZOCOR) 10 MG tablet Take 10 mg by mouth every evening.    Vitamin A 2400 MCG (8000 UT) TABS Take 2,400 mcg by mouth daily.   VITAMIN E PO Take 2,400 mcg by mouth daily.    [DISCONTINUED] diclofenac Sodium (VOLTAREN) 1 % GEL Apply 4 g topically 4 (four) times daily.   [DISCONTINUED]  metoprolol succinate (TOPROL XL) 25 MG 24 hr tablet Take 1 tablet (25 mg total) by mouth daily.   [DISCONTINUED] ondansetron (ZOFRAN ODT) 4 MG disintegrating tablet Take 1 tablet (4 mg total) by mouth every 8 (eight) hours as needed for nausea or vomiting.   [DISCONTINUED] pantoprazole (PROTONIX) 40 MG tablet Take 1 tablet (40 mg total) by mouth daily.   [DISCONTINUED] rivaroxaban (XARELTO) 10 MG TABS tablet Take 1 tablet (10 mg total) by mouth daily.    Allergies:   Codeine, Demerol [meperidine], and Robitussin dm [dextromethorphan-guaifenesin]   Social History   Tobacco Use   Smoking status: Never   Smokeless tobacco: Never  Vaping Use   Vaping Use: Never used  Substance Use Topics   Alcohol use: No   Drug use: No    Family Hx: The patient's family history includes Diabetes in her mother; Heart disease in her mother; Hypertension in her father and sister; Prostate cancer in her father; Uterine cancer in her mother.  ROS   EKGs/Labs/Other Test Reviewed:    EKG:  EKG is not ordered today.    Recent Labs: No results found for requested labs within last 365 days.   Recent Lipid Panel No results for input(s): "CHOL", "TRIG", "HDL", "VLDL", "LDLCALC", "LDLDIRECT" in the last 8760 hours.   Risk Assessment/Calculations/Metrics:              Physical Exam:    VS:  BP 128/82   Pulse 78   Ht 5\' 2"  (1.575 m)   Wt 166 lb 6.4 oz (75.5 kg)   SpO2 97%   BMI 30.43 kg/m     Wt Readings from Last 3 Encounters:  04/08/22 166 lb 6.4 oz (75.5 kg)  02/11/22 168 lb 3.2 oz (76.3 kg)  02/26/20 155 lb (70.3 kg)    Constitutional:      Appearance: Healthy appearance. Not in distress.  Neck:     Vascular: JVD normal.  Pulmonary:     Effort: Pulmonary effort is normal.     Breath sounds: No wheezing. No rales.  Cardiovascular:     Normal rate. Regular rhythm. Normal S1. Normal S2.      Murmurs: There is a grade 1/6 early systolic murmur at the URSB.  Edema:    Peripheral edema  absent.  Abdominal:     Palpations: Abdomen is soft.  Skin:    General: Skin is warm and dry.  Neurological:     Mental Status: Alert and oriented to person, place and time.         ASSESSMENT & PLAN:   PAT (paroxysmal atrial tachycardia) (HCC) Her symptoms of palpitations are  improved on low dose Toprol XL. She does not like to take medications and would like to reduce the Toprol XL in 1/2. She can decrease it to 12.5 mg once daily. If she has more palpitations, she should increase back to 25 mg once daily. I did advise her to take an extra 12.5 mg as needed for increased palpitations. F/u 1 year.             Dispo:  Return in about 1 year (around 04/09/2023) for Routine Follow Up w/ Dr. Mayford Knife.   Medication Adjustments/Labs and Tests Ordered: Current medicines are reviewed at length with the patient today.  Concerns regarding medicines are outlined above.  Tests Ordered: No orders of the defined types were placed in this encounter.  Medication Changes: Meds ordered this encounter  Medications   metoprolol succinate (TOPROL XL) 25 MG 24 hr tablet    Sig: Take 0.5 tablets (12.5 mg total) by mouth daily.    Dispense:  45 tablet    Refill:  3   Signed, Tereso Newcomer, PA-C  04/08/2022 10:01 AM    [facility] [address] [place], [place], [place]  [address] Phone: [phone]; Fax: [phone]

## 2022-04-08 ENCOUNTER — Ambulatory Visit: Payer: Medicare PPO | Attending: Physician Assistant | Admitting: Physician Assistant

## 2022-04-08 ENCOUNTER — Encounter: Payer: Self-pay | Admitting: Physician Assistant

## 2022-04-08 VITALS — BP 128/82 | HR 78 | Ht 62.0 in | Wt 166.4 lb

## 2022-04-08 DIAGNOSIS — I4719 Other supraventricular tachycardia: Secondary | ICD-10-CM | POA: Diagnosis not present

## 2022-04-08 MED ORDER — METOPROLOL SUCCINATE ER 25 MG PO TB24: 12.5000 mg | ORAL_TABLET | Freq: Every day | ORAL | 3 refills | Status: DC

## 2022-04-08 NOTE — Patient Instructions (Signed)
Medication Instructions:  Your physician has recommended you make the following change in your medication:   REDUCE the Metoprolol to 25 mg taking only 1/2 tablet daily, if the palpitations worsen then you can increase it back to 1 tablet daily   *If you need a refill on your cardiac medications before your next appointment, please call your pharmacy*   Lab Work: None ordered  If you have labs (blood work) drawn today and your tests are completely normal, you will receive your results only by: [place] (if you have MyChart) OR A paper copy in the mail If you have any lab test that is abnormal or we need to change your treatment, we will call you to review the results.   Testing/Procedures: None ordered   Follow-Up: At [facility], you and your health needs are our priority.  As part of our continuing mission to provide you with exceptional heart care, we have created designated Provider Care Teams.  These Care Teams include your primary Cardiologist (physician) and Advanced Practice Providers (APPs -  Physician Assistants and Nurse Practitioners) who all work together to provide you with the care you need, when you need it.  We recommend signing up for the patient portal called "MyChart".  Sign up information is provided on this After Visit Summary.  MyChart is used to connect with patients for Virtual Visits (Telemedicine).  Patients are able to view lab/test results, encounter notes, upcoming appointments, etc.  Non-urgent messages can be sent to your provider as well.   To learn more about what you can do with MyChart, go to [email or web address].    Your next appointment:   6 month(s)  The format for your next appointment:   In Person  Provider:   Fransico Him, MD     Other Instructions   Important Information About Sugar

## 2022-04-08 NOTE — Assessment & Plan Note (Signed)
Her symptoms of palpitations are improved on low dose Toprol XL. She does not like to take medications and would like to reduce the Toprol XL in 1/2. She can decrease it to 12.5 mg once daily. If she has more palpitations, she should increase back to 25 mg once daily. I did advise her to take an extra 12.5 mg as needed for increased palpitations. F/u 1 year.

## 2022-05-27 DIAGNOSIS — H903 Sensorineural hearing loss, bilateral: Secondary | ICD-10-CM | POA: Diagnosis not present

## 2022-06-12 DIAGNOSIS — E119 Type 2 diabetes mellitus without complications: Secondary | ICD-10-CM | POA: Diagnosis not present

## 2022-07-24 DIAGNOSIS — Z6831 Body mass index (BMI) 31.0-31.9, adult: Secondary | ICD-10-CM | POA: Diagnosis not present

## 2022-07-24 DIAGNOSIS — F419 Anxiety disorder, unspecified: Secondary | ICD-10-CM | POA: Diagnosis not present

## 2022-07-24 DIAGNOSIS — F339 Major depressive disorder, recurrent, unspecified: Secondary | ICD-10-CM | POA: Diagnosis not present

## 2022-07-24 DIAGNOSIS — R7303 Prediabetes: Secondary | ICD-10-CM | POA: Diagnosis not present

## 2022-07-24 DIAGNOSIS — D6852 Prothrombin gene mutation: Secondary | ICD-10-CM | POA: Diagnosis not present

## 2022-07-24 DIAGNOSIS — Z0000 Encounter for general adult medical examination without abnormal findings: Secondary | ICD-10-CM | POA: Diagnosis not present

## 2022-07-24 DIAGNOSIS — E782 Mixed hyperlipidemia: Secondary | ICD-10-CM | POA: Diagnosis not present

## 2022-07-24 DIAGNOSIS — K580 Irritable bowel syndrome with diarrhea: Secondary | ICD-10-CM | POA: Diagnosis not present

## 2022-09-19 DIAGNOSIS — K580 Irritable bowel syndrome with diarrhea: Secondary | ICD-10-CM | POA: Diagnosis not present

## 2022-09-19 DIAGNOSIS — Z86010 Personal history of colonic polyps: Secondary | ICD-10-CM | POA: Diagnosis not present

## 2022-10-20 DIAGNOSIS — L820 Inflamed seborrheic keratosis: Secondary | ICD-10-CM | POA: Diagnosis not present

## 2022-10-20 DIAGNOSIS — L219 Seborrheic dermatitis, unspecified: Secondary | ICD-10-CM | POA: Diagnosis not present

## 2022-11-03 DIAGNOSIS — Z09 Encounter for follow-up examination after completed treatment for conditions other than malignant neoplasm: Secondary | ICD-10-CM | POA: Diagnosis not present

## 2022-11-03 DIAGNOSIS — K5730 Diverticulosis of large intestine without perforation or abscess without bleeding: Secondary | ICD-10-CM | POA: Diagnosis not present

## 2022-11-03 DIAGNOSIS — Z86010 Personal history of colonic polyps: Secondary | ICD-10-CM | POA: Diagnosis not present

## 2023-01-09 DIAGNOSIS — Z01419 Encounter for gynecological examination (general) (routine) without abnormal findings: Secondary | ICD-10-CM | POA: Diagnosis not present

## 2023-01-09 DIAGNOSIS — Z1231 Encounter for screening mammogram for malignant neoplasm of breast: Secondary | ICD-10-CM | POA: Diagnosis not present

## 2023-01-09 DIAGNOSIS — Z6830 Body mass index (BMI) 30.0-30.9, adult: Secondary | ICD-10-CM | POA: Diagnosis not present

## 2023-01-21 ENCOUNTER — Other Ambulatory Visit: Payer: Self-pay | Admitting: Obstetrics & Gynecology

## 2023-01-21 DIAGNOSIS — E049 Nontoxic goiter, unspecified: Secondary | ICD-10-CM

## 2023-01-26 ENCOUNTER — Ambulatory Visit
Admission: RE | Admit: 2023-01-26 | Discharge: 2023-01-26 | Disposition: A | Discharge status: Home / Self Care | Payer: Medicare PPO | Source: Ambulatory Visit | Attending: Obstetrics & Gynecology | Admitting: Obstetrics & Gynecology

## 2023-01-26 DIAGNOSIS — D229 Melanocytic nevi, unspecified: Secondary | ICD-10-CM | POA: Diagnosis not present

## 2023-01-26 DIAGNOSIS — Z86018 Personal history of other benign neoplasm: Secondary | ICD-10-CM | POA: Diagnosis not present

## 2023-01-26 DIAGNOSIS — E049 Nontoxic goiter, unspecified: Secondary | ICD-10-CM

## 2023-01-26 DIAGNOSIS — L821 Other seborrheic keratosis: Secondary | ICD-10-CM | POA: Diagnosis not present

## 2023-01-26 DIAGNOSIS — D1801 Hemangioma of skin and subcutaneous tissue: Secondary | ICD-10-CM | POA: Diagnosis not present

## 2023-01-26 DIAGNOSIS — L814 Other melanin hyperpigmentation: Secondary | ICD-10-CM | POA: Diagnosis not present

## 2023-01-26 DIAGNOSIS — L578 Other skin changes due to chronic exposure to nonionizing radiation: Secondary | ICD-10-CM | POA: Diagnosis not present

## 2023-01-26 DIAGNOSIS — E042 Nontoxic multinodular goiter: Secondary | ICD-10-CM | POA: Diagnosis not present

## 2023-02-14 ENCOUNTER — Other Ambulatory Visit: Payer: Self-pay | Admitting: Cardiology

## 2023-04-01 ENCOUNTER — Telehealth: Payer: Self-pay | Admitting: Cardiology

## 2023-04-01 ENCOUNTER — Other Ambulatory Visit: Payer: Self-pay | Admitting: *Deleted

## 2023-04-01 MED ORDER — METOPROLOL SUCCINATE ER 25 MG PO TB24: 12.5000 mg | ORAL_TABLET | Freq: Every day | ORAL | 0 refills | Status: DC

## 2023-04-01 NOTE — Telephone Encounter (Signed)
*  STAT* If patient is at the pharmacy, call can be transferred to refill team.   1. Which medications need to be refilled? (please list name of each medication and dose if known) metoprolol succinate (TOPROL-XL) 25 MG 24 hr tablet    2. Would you like to learn more about the convenience, safety, & potential cost savings by using the [facility]? No      3. Are you open to using the [facility] (Type [facility]. No ).   4. Which pharmacy/location (including street and city if local pharmacy) is medication to be sent to?[facility] - [place] ([place]), [place] - [address]    5. Do they need a 30 day or 90 day supply? 90

## 2023-06-01 ENCOUNTER — Telehealth: Payer: Self-pay | Admitting: Cardiology

## 2023-06-01 MED ORDER — METOPROLOL SUCCINATE ER 25 MG PO TB24: 25.0000 mg | ORAL_TABLET | Freq: Every day | ORAL | 0 refills | Status: DC

## 2023-06-01 NOTE — Telephone Encounter (Signed)
Last OV note pt had with Tereso Newcomer PA-C indicating Toprol XL can be increased as tolerated:    ASSESSMENT & PLAN:   PAT (paroxysmal atrial tachycardia) (HCC) Her symptoms of palpitations are improved on low dose Toprol XL. She does not like to take medications and would like to reduce the Toprol XL in 1/2. She can decrease it to 12.5 mg once daily. If she has more palpitations, she should increase back to 25 mg once daily. I did advise her to take an extra 12.5 mg as needed for increased palpitations. F/u 1 year.     Will send in refill for the full tablet of Toprol XL 25 mg po daily to the pts pharmacy on file.  Sent in 35 tablets that will get her through to her next follow-up appt with our office, as scheduled with Dr. Mayford Knife on 07/03/23. Pt aware of this plan and was gracious for all the assistance provided.

## 2023-06-01 NOTE — Telephone Encounter (Signed)
Pt c/o medication issue:  1. Name of Medication:   metoprolol succinate (TOPROL-XL) 25 MG 24 hr tablet    2. How are you currently taking this medication (dosage and times per day)? 1 full tablet 25 mg   3. Are you having a reaction (difficulty breathing--STAT)?   4. What is your medication issue? Patient states that she has been taking a whole tab of this medication and has run out and would like to continue to take one tab. Requesting more called in.

## 2023-06-04 ENCOUNTER — Ambulatory Visit: Payer: Medicare PPO | Admitting: Physician Assistant

## 2023-06-04 ENCOUNTER — Other Ambulatory Visit (INDEPENDENT_AMBULATORY_CARE_PROVIDER_SITE_OTHER): Payer: Medicare PPO

## 2023-06-04 ENCOUNTER — Encounter: Payer: Self-pay | Admitting: Physician Assistant

## 2023-06-04 DIAGNOSIS — M25571 Pain in right ankle and joints of right foot: Secondary | ICD-10-CM | POA: Diagnosis not present

## 2023-06-04 NOTE — Progress Notes (Signed)
Office Visit Note   Patient: [name]           Date of Birth: 1945/07/07           MRN: [id number] Visit Date: 06/04/2023              Requested by: Soundra Pilon, FNP [id number] [place],  [place] [address] PCP: Soundra Pilon, FNP   Assessment & Plan: Visit Diagnoses:  1. Pain in right ankle and joints of right foot     Plan: Patient is here with a chief complaint of some right ankle pain and swelling over the lateral ankle.  Did not really have an injury just got up and began having pain.  Denies any fever chills previous difficulties.  As a week or on she started having some pain in her knee probably from altered gait.  Her exam is fairly normal I do not see any concerns on x-ray.  She does have some bruising but no swelling may be consistent with a varicosity.  Compartments are soft nontender negative Homans' sign.  She is getting somewhat better so I have encouraged her to wear more supportive shoe wear treat this symptomatically given her with strict return precautions.  Will follow-up as needed  Follow-Up Instructions: As needed  Orders:  Orders Placed This Encounter  Procedures   XR Ankle Complete Right   No orders of the defined types were placed in this encounter.     Procedures: No procedures performed   Clinical Data: No additional findings.   Subjective: Chief Complaint  Patient presents with   Right Ankle - Pain    HPI pleasant 77 year old woman with a 2 to 3-day history of right ankle pain no particular injury.  Noticed some bruising and swelling initially swelling has decreased she has been using Biofreeze and elevating her leg.  Denies any fever chills or calf pain  Review of Systems  All other systems reviewed and are negative.   Objective: Vital Signs: There were no vitals taken for this visit.  Physical Exam Constitutional:      Appearance: Normal appearance.  Neurological:     General: No focal deficit present.      Mental Status: She is alert and oriented to person, place, and time.  Psychiatric:        Mood and Affect: Mood normal.   Ortho Exam Right ankle she is neurovascular intact has quite a few varicosities has good dorsiflexion plantarflexion eversion inversion without much pain.  Calf is soft nontender negative Homans' sign no known pain reproduced with manipulation of the subtalar joint no plantar ecchymosis has some tenderness over some bruising at the lateral ankle joint Specialty Comments:  No specialty comments available.  Imaging: No results found.   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     IBS (irritable bowel syndrome)    Other and unspecified hyperlipidemia    Palpitations    PAT (paroxysmal atrial tachycardia) (HCC)  Pre-diabetes    Vertigo     Family History  Problem Relation Age of Onset   Heart disease Mother    Uterine cancer Mother    Diabetes Mother    Hypertension Father    Prostate cancer Father    Hypertension Sister     Past Surgical History:  Procedure Laterality Date   ANTERIOR AND POSTERIOR REPAIR     CATARACT EXTRACTION  1998, 2003   bilateral   KNEE ARTHROSCOPY Right 01/06/2019   Procedure: RIGHT KNEE ARTHROSCOPY WITH DEBRIDEMENT AND PARTIAL LATERAL MENISCECTOMY;  Surgeon: Kathryne Hitch, MD;  Location: [facility] OR;  Service: Orthopedics;  Laterality: Right;   LAPAROSCOPIC CHOLECYSTECTOMY  2006   TONSILLECTOMY     and adenoids   TOTAL ABDOMINAL HYSTERECTOMY  1985   TUBAL  LIGATION     Social History   Occupational History   Not on file  Tobacco Use   Smoking status: Never   Smokeless tobacco: Never  Vaping Use   Vaping status: Never Used  Substance and Sexual Activity   Alcohol use: No   Drug use: No   Sexual activity: Not on file

## 2023-07-01 ENCOUNTER — Other Ambulatory Visit: Payer: Self-pay | Admitting: Physician Assistant

## 2023-07-03 ENCOUNTER — Other Ambulatory Visit: Payer: Self-pay

## 2023-07-03 ENCOUNTER — Encounter: Payer: Self-pay | Admitting: Cardiology

## 2023-07-03 ENCOUNTER — Ambulatory Visit: Payer: Medicare PPO | Attending: Cardiology | Admitting: Cardiology

## 2023-07-03 ENCOUNTER — Telehealth: Payer: Self-pay | Admitting: Cardiology

## 2023-07-03 VITALS — BP 134/62 | HR 78 | Ht 62.0 in | Wt 156.0 lb

## 2023-07-03 DIAGNOSIS — R9431 Abnormal electrocardiogram [ECG] [EKG]: Secondary | ICD-10-CM

## 2023-07-03 DIAGNOSIS — I4510 Unspecified right bundle-branch block: Secondary | ICD-10-CM

## 2023-07-03 DIAGNOSIS — I4719 Other supraventricular tachycardia: Secondary | ICD-10-CM

## 2023-07-03 MED ORDER — METOPROLOL SUCCINATE ER 25 MG PO TB24: 25.0000 mg | ORAL_TABLET | Freq: Every day | ORAL | 3 refills | Status: DC

## 2023-07-03 NOTE — Telephone Encounter (Signed)
*  STAT* If patient is at the pharmacy, call can be transferred to refill team.   1. Which medications need to be refilled? (please list name of each medication and dose if known) metoprolol succinate (TOPROL XL) 25 MG 24 hr tablet   2. Which pharmacy/location (including street and city if local pharmacy) is medication to be sent to?  [id number] - [place] ([place]), [place] - [address]    3. Do they need a 30 day or 90 day supply? 90  Patient is out of medication

## 2023-07-03 NOTE — Telephone Encounter (Signed)
Pt's medication was sent to pt's pharmacy as requested. Confirmation received.  °

## 2023-07-03 NOTE — Patient Instructions (Signed)
Medication Instructions:  Your physician recommends that you continue on your current medications as directed. Please refer to the Current Medication list given to you today.  *If you need a refill on your cardiac medications before your next appointment, please call your pharmacy*  Lab Work: None ordered today.  Testing/Procedures: Your physician has requested that you have an echocardiogram. Echocardiography is a painless test that uses sound waves to create images of your heart. It provides your doctor with information about the size and shape of your heart and how well your heart's chambers and valves are working. This procedure takes approximately one hour. There are no restrictions for this procedure. Please do NOT wear cologne, perfume, aftershave, or lotions (deodorant is allowed). Please arrive 15 minutes prior to your appointment time.  Please note: We ask at that you not bring children with you during ultrasound (echo/ vascular) testing. Due to room size and safety concerns, children are not allowed in the ultrasound rooms during exams. Our front office staff cannot provide observation of children in our lobby area while testing is being conducted. An adult accompanying a patient to their appointment will only be allowed in the ultrasound room at the discretion of the ultrasound technician under special circumstances. We apologize for any inconvenience.  Your physician has requested that you have a lexiscan myoview. For further information please visit [email or web address] Please follow instruction sheet, as given.   Follow-Up: At [facility], you and your health needs are our priority.  As part of our continuing mission to provide you with exceptional heart care, we have created designated Provider Care Teams.  These Care Teams include your primary Cardiologist (physician) and Advanced Practice Providers (APPs -  Physician Assistants and Nurse Practitioners) who all work together to  provide you with the care you need, when you need it.  We recommend signing up for the patient portal called "MyChart".  Sign up information is provided on this After Visit Summary.  MyChart is used to connect with patients for Virtual Visits (Telemedicine).  Patients are able to view lab/test results, encounter notes, upcoming appointments, etc.  Non-urgent messages can be sent to your provider as well.   To learn more about what you can do with MyChart, go to [email or web address].    Your next appointment:   1 year(s)  The format for your next appointment:   In Person  Provider:   [name], MD {  Other Instructions Steffanie Dunn (Stress Test) Instructions  Please arrive 15 minutes prior to your appointment time for registration and insurance purposes.   The test will take approximately 3 to 4 hours to complete; you may bring reading material.  If someone comes with you to your appointment, they will need to remain in the main lobby due to limited space in the testing area. **If you are pregnant or breastfeeding, please notify the nuclear lab prior to your appointment**   How to prepare for your Myocardial Perfusion Test: Do not eat or drink 3 hours prior to your test, except you may have water. Do not consume products containing caffeine (regular or decaffeinated) 12 hours prior to your test. (ex: coffee, chocolate, sodas, tea). Do bring a list of your current medications with you.  If not listed below, you may take your medications as normal. Do wear comfortable clothes (no dresses or overalls) and walking shoes, tennis shoes preferred (No heels or open toe shoes are allowed). Do NOT wear cologne, perfume, aftershave, or lotions (deodorant is allowed). If  these instructions are not followed, your test will have to be rescheduled.   Please report to [address], [address] for your test.  If you have questions or concerns about your appointment, you can call the  Nuclear Lab at [phone].   If you cannot keep your appointment, please provide 24 hours notification to the Nuclear Lab, to avoid a possible $50 charge to your account.

## 2023-07-03 NOTE — Addendum Note (Signed)
Addended by: Franchot Gallo on: 07/03/2023 09:16 AM   Modules accepted: Orders

## 2023-07-03 NOTE — Progress Notes (Signed)
Attestation for [facility].

## 2023-07-03 NOTE — Progress Notes (Signed)
Cardiology Note    Date:  07/03/2023   ID:  [name], DOB [birth year], MRN [id number]  PCP:  Soundra Pilon, FNP  Cardiologist:  [name], MD   Chief Complaint  Patient presents with   Follow-up    Paroxysmal atrial tachycardia    History of Present Illness:  [name] is a 78 y.o. female with a history of hereditary thrombophilia, IBS, prediabetes, depression, vertigo and palpitations.  She was seen by cardiology back in 2017 for epigastric pain.  2D echo showed Normal LV function with EF 60 to 65% and grade 1 diastolic dysfunction.  Nuclear stress test showed no ischemia.  She does have a family history of her mother needing a pacemaker but no history of CAD.  She was last seen by me for complaints of palpitations.  Heart monitor showed normal sinus rhythm with average heart rate 83 bpm with episodes of nonsustained atrial tachycardia as long as 9 beats in a row and rare PACs atrial couplets and atrial triplets as well as rare PVCs.  She was started on Toprol-XL 25 mg daily.  She was seen back by Tereso Newcomer 04/04/2022 and palpitations had improved on low-dose Toprol.  She stated that time she did not like taking medications so her Toprol was decreased to 12.5 mg daily with advisement to increase back to 25 mg daily if palpitations increased.  She is now back for follow-up.  She is here today for followup and is doing well.  She denies any chest pain or pressure, SOB, DOE, PND, orthopnea, LE edema, dizziness, palpitations or syncope. She is compliant with her meds and is tolerating meds with no SE.    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     IBS (irritable bowel syndrome)    Other and unspecified hyperlipidemia    Palpitations    PAT (paroxysmal atrial tachycardia) (HCC)    Pre-diabetes    Vertigo      Past Surgical History:  Procedure Laterality Date   ANTERIOR AND POSTERIOR REPAIR     CATARACT EXTRACTION  1998, 2003   bilateral   KNEE ARTHROSCOPY Right 01/06/2019   Procedure: RIGHT KNEE ARTHROSCOPY WITH DEBRIDEMENT AND PARTIAL LATERAL MENISCECTOMY;  Surgeon: Kathryne Hitch, MD;  Location: [facility] OR;  Service: Orthopedics;  Laterality: Right;   LAPAROSCOPIC CHOLECYSTECTOMY  2006   TONSILLECTOMY     and adenoids   TOTAL ABDOMINAL HYSTERECTOMY  1985   TUBAL LIGATION      Current Medications: Current Meds  Medication Sig   acetaminophen (TYLENOL) 500 MG tablet Take 1,000 mg by mouth every 6 (six) hours as needed for moderate pain.   ALPRAZolam (XANAX) 0.5 MG tablet Take 0.5 mg by mouth 3 (three) times daily as needed for anxiety.    Ascorbic Acid (VITAMIN C) 1000 MG tablet Take 1,000 mg by mouth every evening.    aspirin EC 81 MG tablet Take 81 mg by mouth every evening.    B Complex Vitamins (B COMPLEX-B12) TABS Take 1 tablet by mouth daily.   Biotin (BIOTIN 5000) 5 MG CAPS Take 5 mg by mouth daily.   Calcium Carbonate-Vit D-Min (CALCIUM 1200 PO) Take 1,200 mg by mouth every evening.   Cholecalciferol (VITAMIN D) 2000 units CAPS Take 2,000 Units by mouth every evening.  Coenzyme Q10 (COQ10) 200 MG CAPS Take 200 mg by mouth daily.    Fish Oil-Krill Oil (KRILL OIL PLUS) CAPS Take 500 mg by mouth daily.    hyoscyamine (LEVSIN SL) 0.125 MG SL tablet Take 0.125 mg by mouth every 4 (four) hours as needed for pain.   loratadine (CLARITIN) 10 MG tablet Take 10 mg by mouth every evening.   MAGNESIUM PO Take 500 mg by mouth every evening.    metoprolol succinate (TOPROL XL) 25 MG 24 hr tablet Take 1 tablet (25 mg total) by mouth daily.   Multiple Vitamin (MULTIVITAMIN WITH MINERALS) TABS tablet Take 1 tablet by mouth daily.   PARoxetine (PAXIL) 20 MG tablet Take 10 mg by mouth every morning.    Potassium 99 MG TABS Take 99 mg by mouth every evening.    Probiotic Product  (PROBIOTIC DAILY PO) Take 1 capsule by mouth daily.   simvastatin (ZOCOR) 10 MG tablet Take 10 mg by mouth every evening.    Vitamin A 2400 MCG (8000 UT) TABS Take 2,400 mcg by mouth daily.   VITAMIN E PO Take 2,400 mcg by mouth daily.     Allergies:   Codeine, Demerol [meperidine], and Robitussin dm [dextromethorphan-guaifenesin]   Social History   Socioeconomic History   Marital status: Married    Spouse name: Not on file   Number of children: Not on file   Years of education: Not on file   Highest education level: Not on file  Occupational History   Not on file  Tobacco Use   Smoking status: Never   Smokeless tobacco: Never  Vaping Use   Vaping status: Never Used  Substance and Sexual Activity   Alcohol use: No   Drug use: No   Sexual activity: Not on file  Other Topics Concern   Not on file  Social History Narrative   Not on file   Social Drivers of Health   Financial Resource Strain: Not on file  Food Insecurity: Not on file  Transportation Needs: Not on file  Physical Activity: Not on file  Stress: Not on file  Social Connections: Unknown (10/24/2021)   [facility]   Social Network    Social Network: Not on file     Family History:  The patient's family history includes Diabetes in her mother; Heart disease in her mother; Hypertension in her father and sister; Prostate cancer in her father; Uterine cancer in her mother.   ROS:   Please see the history of present illness.    ROS All other systems reviewed and are negative.      No data to display             PHYSICAL EXAM:   VS:  BP 134/62   Pulse 78   Ht 5\' 2"  (1.575 m)   Wt 156 lb (70.8 kg)   BMI 28.53 kg/m    GEN: Well nourished, well developed in no acute distress HEENT: Normal NECK: No JVD; No carotid bruits LYMPHATICS: No lymphadenopathy CARDIAC:RRR, no murmurs, rubs, gallops RESPIRATORY:  Clear to auscultation without rales, wheezing or rhonchi   ABDOMEN: Soft, non-tender, non-distended MUSCULOSKELETAL:  No edema; No deformity  SKIN: Warm and dry NEUROLOGIC:  Alert and oriented x 3 PSYCHIATRIC:  Normal affect  Wt Readings from Last 3 Encounters:  07/03/23 156 lb (70.8 kg)  04/08/22 166 lb 6.4 oz (75.5 kg)  02/11/22 168 lb 3.2 oz (76.3 kg)      Studies/Labs  Reviewed:    Recent Labs: No results found for requested labs within last 365 days.   Lipid Panel No results found for: "CHOL", "TRIG", "HDL", "CHOLHDL", "VLDL", "LDLCALC", "LDLDIRECT"   CHA2DS2-VASc Score =     This indicates a  % annual risk of stroke. The patient's score is based upon:     ASSESSMENT:    1. PAT (paroxysmal atrial tachycardia) (HCC)      PLAN:  In order of problems listed above:  Palpitations PAT PVCs -2-week Zio patch showed several episodes of nonsustained atrial tachycardia up to 9 beats in a row with rare PACs, atrial couplets and rare PVCs -She was placed on Toprol XL 25 mg daily  -Palpitations are well-controlled on low-dose beta-blocker -Continue drug management with Toprol-XL 25 mg daily with as needed refills  RBBB -this is new on EKG today -she is asymptomatic -will check 2D echo and Norberta Keens -Informed Consent   Shared Decision Making/Informed Consent The risks [chest pain, shortness of breath, cardiac arrhythmias, dizziness, blood pressure fluctuations, myocardial infarction, stroke/transient ischemic attack, nausea, vomiting, allergic reaction, radiation exposure, metallic taste sensation and life-threatening complications (estimated to be 1 in 10,000)], benefits (risk stratification, diagnosing coronary artery disease, treatment guidance) and alternatives of a nuclear stress test were discussed in detail with [name] and she agrees to proceed.     Time Spent: 20 minutes total time of encounter, including 15 minutes spent in face-to-face patient care on the date of this encounter. This time includes  coordination of care and counseling regarding above mentioned problem list. Remainder of non-face-to-face time involved reviewing chart documents/testing relevant to the patient encounter and documentation in the medical record. I have independently reviewed documentation from referring provider  Medication Adjustments/Labs and Tests Ordered: Current medicines are reviewed at length with the patient today.  Concerns regarding medicines are outlined above.  Medication changes, Labs and Tests ordered today are listed in the Patient Instructions below.  There are no Patient Instructions on file for this visit.   Signed, [name], MD  07/03/2023 9:08 AM    [facility] [address] [place], [place], [place]  [address] Phone: [phone]; Fax: [phone]

## 2023-07-15 ENCOUNTER — Encounter (HOSPITAL_COMMUNITY): Payer: Self-pay

## 2023-07-15 DIAGNOSIS — R109 Unspecified abdominal pain: Secondary | ICD-10-CM | POA: Diagnosis not present

## 2023-07-17 ENCOUNTER — Telehealth (HOSPITAL_COMMUNITY): Payer: Self-pay | Admitting: *Deleted

## 2023-07-17 NOTE — Telephone Encounter (Signed)
Spoke with patient and she was given detailed instructions about her STRESS TEST on 07/24/23 at 10:30.

## 2023-07-24 ENCOUNTER — Ambulatory Visit (HOSPITAL_BASED_OUTPATIENT_CLINIC_OR_DEPARTMENT_OTHER): Payer: Medicare PPO

## 2023-07-24 ENCOUNTER — Ambulatory Visit (HOSPITAL_COMMUNITY): Payer: Medicare PPO | Attending: Cardiology

## 2023-07-24 DIAGNOSIS — R9431 Abnormal electrocardiogram [ECG] [EKG]: Secondary | ICD-10-CM

## 2023-07-24 DIAGNOSIS — I4510 Unspecified right bundle-branch block: Secondary | ICD-10-CM | POA: Diagnosis not present

## 2023-07-24 LAB — MYOCARDIAL PERFUSION IMAGING
LV dias vol: 48 mL (ref 46–106)
LV sys vol: 10 mL
Nuc Stress EF: 79 %
Peak HR: 100 {beats}/min
Rest HR: 55 {beats}/min
Rest Nuclear Isotope Dose: 10.8 mCi
SDS: 1
SRS: 0
SSS: 1
ST Depression (mm): 0 mm
Stress Nuclear Isotope Dose: 31.9 mCi
TID: 1.06

## 2023-07-24 LAB — ECHOCARDIOGRAM COMPLETE
Area-P 1/2: 2.87 cm2
S' Lateral: 2.5 cm

## 2023-07-24 MED ORDER — TECHNETIUM TC 99M TETROFOSMIN IV KIT
10.8000 | PACK | Freq: Once | INTRAVENOUS | Status: AC | PRN
  Administered 2023-07-24: 10.8 via INTRAVENOUS

## 2023-07-24 MED ORDER — REGADENOSON 0.4 MG/5ML IV SOLN
0.4000 mg | Freq: Once | INTRAVENOUS | Status: AC
  Administered 2023-07-24: 0.4 mg via INTRAVENOUS

## 2023-07-24 MED ORDER — TECHNETIUM TC 99M TETROFOSMIN IV KIT
31.9000 | PACK | Freq: Once | INTRAVENOUS | Status: AC | PRN
  Administered 2023-07-24: 31.9 via INTRAVENOUS

## 2023-07-31 ENCOUNTER — Telehealth: Payer: Self-pay

## 2023-07-31 DIAGNOSIS — I4719 Other supraventricular tachycardia: Secondary | ICD-10-CM

## 2023-07-31 DIAGNOSIS — R002 Palpitations: Secondary | ICD-10-CM

## 2023-07-31 DIAGNOSIS — I071 Rheumatic tricuspid insufficiency: Secondary | ICD-10-CM

## 2023-07-31 DIAGNOSIS — I4510 Unspecified right bundle-branch block: Secondary | ICD-10-CM

## 2023-07-31 DIAGNOSIS — I340 Nonrheumatic mitral (valve) insufficiency: Secondary | ICD-10-CM

## 2023-07-31 DIAGNOSIS — I2699 Other pulmonary embolism without acute cor pulmonale: Secondary | ICD-10-CM

## 2023-07-31 DIAGNOSIS — Z79899 Other long term (current) drug therapy: Secondary | ICD-10-CM

## 2023-07-31 DIAGNOSIS — I50810 Right heart failure, unspecified: Secondary | ICD-10-CM

## 2023-07-31 NOTE — Telephone Encounter (Signed)
-----   Message from [name] sent at 07/26/2023  7:45 PM EST ----- Please let patient know that stress test was fine

## 2023-07-31 NOTE — Telephone Encounter (Signed)
-----   Message from [name] sent at 07/26/2023  8:30 PM EST ----- Echo showed normal LVF EF 60-65% with moderately enlarged and decreased function of the right ventricle that pumps blood to lungs,mildly leaky mitral valve, moderate to severe leakiness of TV, mild AV thickening with no stenosis.  Repeat echo in 1 year for MR and TR.    Unclear why RVF has declined since last echo>>?if RBBB contributing.  No PHTN.  Please get an [name] HST to assess for OSA.  Check PFTs with [facility]CO.  Check ESR, CRP, ANA, RF.  check VQ scan to rule out chronic PE

## 2023-07-31 NOTE — Telephone Encounter (Signed)
Call to patient to discuss echo results. Explained to patient that Echo showed normal LVF EF 60-65% with moderately enlarged and decreased function of the right ventricle, mildly leaky mitral valve, moderate to severe leakiness of TV, mild AV thickening with no stenosis. Patient agrees to repeat echo in 1 year for MR and TR.   Explained that in regards to decrease in right ventricle function, Dr. Mayford Knife would like to run  Some labwork to rule out inflammatory and autoimmune conditions. Dr. Mayford Knife would also like patient to complete a home sleep study, VQ Scan and PFT. Patient agrees to all, Orders placed.

## 2023-07-31 NOTE — Telephone Encounter (Signed)
Call to discuss normal stress test results, patient verbalizes understanding.

## 2023-08-03 NOTE — Telephone Encounter (Signed)
 Pt will come by the office 08/10/23 to set up [name] sleep study. Stopbang 2. Dr. Mayford Knife ordered the study.

## 2023-08-03 NOTE — Telephone Encounter (Signed)
 Patient Name:         DOB:       Height:     Weight:  Office Name:         Referring Provider:  Today's Date:  Date:   STOP BANG RISK ASSESSMENT S (snore) Have you been told that you snore?     YES   T (tired) Are you often tired, fatigued, or sleepy during the day?   NO  O (obstruction) Do you stop breathing, choke, or gasp during sleep? NO   P (pressure) Do you have or are you being treated for high blood pressure? NO   B (BMI) Is your body index greater than 35 kg/m? NO   A (age) Are you 80 years old or older? YES   N (neck) Do you have a neck circumference greater than 16 inches?   NO   G (gender) Are you a female? NO   TOTAL STOP/BANG "YES" ANSWERS 2                                                                        For Office Use Only              Procedure Order Form    YES to 3+ Stop Bang questions OR two clinical symptoms - patient qualifies for WatchPAT (CPT 95800)             Clinical Notes: Will consult Sleep Specialist and refer for management of therapy due to patient increased risk of Sleep Apnea. Ordering a sleep study due to the following two clinical symptoms: Excessive daytime sleepiness G47.10 / Gastroesophageal reflux K21.9 / Nocturia R35.1 / Morning Headaches G44.221 / Difficulty concentrating R41.840 / Memory problems or poor judgment G31.84 / Personality changes or irritability R45.4 / Loud snoring R06.83 / Depression F32.9 / Unrefreshed by sleep G47.8 / Impotence N52.9 / History of high blood pressure R03.0 / Insomnia G47.00    I understand that I am proceeding with a home sleep apnea test as ordered by my treating physician. I understand that untreated sleep apnea is a serious cardiovascular risk factor and it is my responsibility to perform the test and seek management for sleep apnea. I will be contacted with the results and be managed for sleep apnea by a local sleep physician. I will be receiving equipment and further instructions from [facility]. I shall promptly ship back the equipment via the included mailing label. I understand my insurance will be billed for the test and as the patient I am responsible for any insurance related out-of-pocket costs incurred. I have been provided with written instructions and can call for additional video or telephonic instruction, with 24-hour availability of qualified personnel to answer any questions: Patient Help Desk [phone].  Patient Signature ______________________________________________________   Date______________________ Patient Telemedicine Verbal Consent

## 2023-08-10 ENCOUNTER — Telehealth: Payer: Self-pay | Admitting: *Deleted

## 2023-08-10 DIAGNOSIS — I4510 Unspecified right bundle-branch block: Secondary | ICD-10-CM | POA: Diagnosis not present

## 2023-08-10 DIAGNOSIS — R002 Palpitations: Secondary | ICD-10-CM | POA: Diagnosis not present

## 2023-08-10 DIAGNOSIS — I4719 Other supraventricular tachycardia: Secondary | ICD-10-CM | POA: Diagnosis not present

## 2023-08-10 DIAGNOSIS — Z79899 Other long term (current) drug therapy: Secondary | ICD-10-CM | POA: Diagnosis not present

## 2023-08-10 NOTE — Telephone Encounter (Signed)
 DR. Harrietta Guardian [name] STUDY.   Patient agreement reviewed and signed on 08/10/2023.  WatchPAT issued to patient on 08/10/2023 by Danielle Rankin, CMA. Patient aware to not open the WatchPAT box until contacted with the activation PIN. Patient profile initialized in CloudPAT on 08/10/2023 by Danielle Rankin, CMA. Device serial number: [id number]  Please list Reason for Call as Advice Only and type "WatchPAT issued to patient" in the comment box.

## 2023-08-10 NOTE — Telephone Encounter (Signed)
 ----- Message from [name] sent at 08/03/2023  3:20 PM EST ----- Regarding: NEEDS [name] SET UP; AWAITING AUTH Pt will come by the office 08/10/23 to set up [name] sleep study. Stopbang 2. Dr. Mayford Knife ordered the study.     You  [name], [name] [phone] minute ago (3:19 PM)  You1 minute ago (3:18 PM)   Patient Name:                                                                           DOB:                                                   Height:                                      Weight:   Office Name:                                                                                      Referring Provider:   Today's Date:   Date:   STOP BANG RISK ASSESSMENT  S (snore) Have you been told that you snore?                                       YES   T (tired) Are you often tired, fatigued, or sleepy during the day?           NO O (obstruction) Do you stop breathing, choke, or gasp during sleep? NO   P (pressure) Do you have or are you being treated for high blood pressure? NO   B (BMI) Is your body index greater than 35 kg/m? NO   A (age) Are you 80 years old or older? YES   N (neck) Do you have a neck circumference greater than 16 inches?    NO   G (gender) Are you a female? NO   TOTAL STOP/BANG "YES" ANSWERS 2                                                                             For Office Use Only  Procedure Order Form                                 YES to 3+ Stop Bang questions OR two clinical symptoms - patient qualifies for WatchPAT (CPT 95800)              Clinical Notes: Will consult Sleep Specialist and refer for management of therapy due to patient increased risk of Sleep Apnea. Ordering a sleep study due to the following two clinical symptoms: Excessive daytime sleepiness G47.10 / Gastroesophageal reflux K21.9 / Nocturia R35.1 / Morning Headaches G44.221 / Difficulty concentrating R41.840 / Memory problems or  poor judgment G31.84 / Personality changes or irritability R45.4 / Loud snoring R06.83 / Depression F32.9 / Unrefreshed by sleep G47.8 / Impotence N52.9 / History of high blood pressure R03.0 / Insomnia G47.00      I understand that I am proceeding with a home sleep apnea test as ordered by my treating physician. I understand that untreated sleep apnea is a serious cardiovascular risk factor and it is my responsibility to perform the test and seek management for sleep apnea. I will be contacted with the results and be managed for sleep apnea by a local sleep physician. I will be receiving equipment and further instructions from [facility]. I shall promptly ship back the equipment via the included mailing label. I understand my insurance will be billed for the test and as the patient I am responsible for any insurance related out-of-pocket costs incurred. I have been provided with written instructions and can call for additional video or telephonic instruction, with 24-hour availability of qualified personnel to answer any questions: Patient Help Desk [phone].   Patient Signature ______________________________________________________   Date______________________ Patient Telemedicine Verbal Consent         Note  [name]  [name], RN3 days ago  [employer] Thanks  Messages that only contain a simple "Thank you" are hidden in In Basket to save you clicks.  Nicole Pucker, RN  You; [facility] Sleep Studies3 days ago   TT ordered home sleep study :)  Nicole Pucker, RN  Nicole Allender Whited3 days ago   [employer],  It was nice to talk to you today.    Per our discussion, Dr. Mayford Knife would like to run some tests to try and figure out why your right ventricle function has declined a little.    She has ordered some lab work to rule out any inflammatory or autoimmune conditions. I have sent your orders to [facility]. You can walk in to any [facility] location to complete these labs next week and  you do not have to be fasting.    She ordered a Pulmonary Function test. This test measure how strong your lungs are. It can tell us if you have conditions asthma or COPD.   She also ordered a VQ scan, which can tell us if you have a blood clot.    Lastly she ordered a home sleep study, which will tell us if sleep apnea is placing a lot of stress on  your heart and lungs.    Someone will also call you to set up a repeat echo for 1 year from now.   Sincerely, Nicole Drought, RN   [name], RN3 days ago   Call to patient to discuss echo results. Explained to patient that Echo showed normal LVF EF 60-65% with moderately  enlarged and decreased function of the right ventricle, mildly leaky mitral valve, moderate to severe leakiness of TV, mild AV thickening with no stenosis. Patient agrees to repeat echo in 1 year for MR and TR.    Explained that in regards to decrease in right ventricle function, Dr. Mayford Knife would like to run  Some labwork to rule out inflammatory and autoimmune conditions. Dr. Mayford Knife would also like patient to complete a home sleep study, VQ Scan and PFT. Patient agrees to all, Orders placed.      Note  Nicole Pucker, RN  [name], [name] [phone] days ago  [name], RN3 days ago   ----- Message from [name] sent at 07/26/2023  8:30 PM EST ----- Echo showed normal LVF EF 60-65% with moderately enlarged and decreased function of the right ventricle that pumps blood to lungs,mildly leaky mitral valve, moderate to severe leakiness of TV, mild AV thickening with no stenosis.  Repeat echo in 1 year for MR and TR.     Unclear why RVF has declined since last echo>>?if RBBB contributing.  No PHTN.  Please get an [name] HST to assess for OSA.  Check PFTs with [facility].  Check ESR, CRP, ANA, RF.  check VQ scan to rule out chronic PE

## 2023-08-10 NOTE — Telephone Encounter (Signed)
 I will check with the sleep coordinator team to see if pt has been approved. If so I will be able to provide the PIN# while she is here for set up of [name] study.

## 2023-08-11 LAB — RHEUMATOID FACTOR: Rheumatoid fact SerPl-aCnc: <10 [IU]/mL (ref <14.0)

## 2023-08-11 LAB — ANA: Anti Nuclear Antibody (ANA): NEGATIVE

## 2023-08-11 LAB — SEDIMENTATION RATE: Sed Rate: 2 mm/h (ref 0–40)

## 2023-08-11 LAB — C-REACTIVE PROTEIN: CRP: <1 mg/L (ref 0–10)

## 2023-08-18 DIAGNOSIS — Z6827 Body mass index (BMI) 27.0-27.9, adult: Secondary | ICD-10-CM | POA: Diagnosis not present

## 2023-08-18 DIAGNOSIS — R7303 Prediabetes: Secondary | ICD-10-CM | POA: Diagnosis not present

## 2023-08-18 DIAGNOSIS — I4719 Other supraventricular tachycardia: Secondary | ICD-10-CM | POA: Diagnosis not present

## 2023-08-18 DIAGNOSIS — D6852 Prothrombin gene mutation: Secondary | ICD-10-CM | POA: Diagnosis not present

## 2023-08-18 DIAGNOSIS — E782 Mixed hyperlipidemia: Secondary | ICD-10-CM | POA: Diagnosis not present

## 2023-08-18 DIAGNOSIS — Z0000 Encounter for general adult medical examination without abnormal findings: Secondary | ICD-10-CM | POA: Diagnosis not present

## 2023-08-28 NOTE — Telephone Encounter (Signed)
 Patient called with her insurance on the phone and called with question about the study. Please advise

## 2023-08-31 ENCOUNTER — Telehealth: Payer: Self-pay

## 2023-08-31 NOTE — Telephone Encounter (Signed)
 [name] Prior Auth 3/17, PIN given via Voicemail, per [facility]

## 2023-08-31 NOTE — Telephone Encounter (Signed)
 Ordering provider: T. 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 Authorization: Yes; tracking ID [id number] Patient notified of PIN (1234) on 08/31/2023 via Notification Method: phone.

## 2023-09-01 ENCOUNTER — Encounter (INDEPENDENT_AMBULATORY_CARE_PROVIDER_SITE_OTHER): Payer: Self-pay | Admitting: Cardiology

## 2023-09-01 DIAGNOSIS — G4733 Obstructive sleep apnea (adult) (pediatric): Secondary | ICD-10-CM | POA: Diagnosis not present

## 2023-09-08 ENCOUNTER — Ambulatory Visit: Attending: Cardiology

## 2023-09-08 ENCOUNTER — Telehealth: Payer: Self-pay

## 2023-09-08 DIAGNOSIS — I071 Rheumatic tricuspid insufficiency: Secondary | ICD-10-CM

## 2023-09-08 DIAGNOSIS — I2699 Other pulmonary embolism without acute cor pulmonale: Secondary | ICD-10-CM

## 2023-09-08 DIAGNOSIS — I50810 Right heart failure, unspecified: Secondary | ICD-10-CM

## 2023-09-08 DIAGNOSIS — R002 Palpitations: Secondary | ICD-10-CM

## 2023-09-08 DIAGNOSIS — I340 Nonrheumatic mitral (valve) insufficiency: Secondary | ICD-10-CM

## 2023-09-08 DIAGNOSIS — I4719 Other supraventricular tachycardia: Secondary | ICD-10-CM

## 2023-09-08 DIAGNOSIS — G4733 Obstructive sleep apnea (adult) (pediatric): Secondary | ICD-10-CM

## 2023-09-08 DIAGNOSIS — I4510 Unspecified right bundle-branch block: Secondary | ICD-10-CM

## 2023-09-08 NOTE — Procedures (Signed)
   SLEEP STUDY REPORT Patient Information Study Date: 09/01/2023 Patient Name: [name] Patient ID: [id number] Birth Date: [birth year] Age: 78 Gender: Female BMI: 28.8 (W=156 lb, H=5' 2'') Stopbang: 2 Referring Physician: [name], MD  TEST DESCRIPTION: Home sleep apnea testing was completed using the WatchPat, a Type 1 device, utilizing  peripheral arterial tonometry (PAT), chest movement, actigraphy, pulse oximetry, pulse rate, body position and snore.  AHI was calculated with apnea and hypopnea using valid sleep time as the denominator. RDI includes apneas,  hypopneas, and RERAs. The data acquired and the scoring of sleep and all associated events were performed in  accordance with the recommended standards and specifications as outlined in the AASM Manual for the Scoring of  Sleep and Associated Events 2.2.0 (2015).  FINDINGS:  1. Moderate Obstructive Sleep Apnea with AHI 26.8/hr overall and 39.4/hr during REM sleep.   2. No Central Sleep Apnea with pAHIc 1.8/hr.  3. Oxygen desaturations as low as 80%.  4. Mild to moderate snoring was present. O2 sats were < 88% for 19.8 min.  5. Total sleep time was 7 hrs and 21 min.  6. 13.3% of total sleep time was spent in REM sleep.   7. Normal sleep onset latency at 16 min  8. Prolonged REM sleep onset latency at 219 min.   9. Total awakenings were 4.  10. Arrhythmia detection: None  DIAGNOSIS:  Moderate to Severe Obstructive Sleep Apnea (G47.33) Nocturnal Hypoxemia  RECOMMENDATIONS: 1. Clinical correlation of these findings is necessary. The decision to treat obstructive sleep apnea (OSA) is usually  based on the presence of apnea symptoms or the presence of associated medical conditions such as Hypertension,  Congestive Heart Failure, Atrial Fibrillation or Obesity. The most common symptoms of OSA are snoring, gasping for  breath while sleeping, daytime sleepiness and fatigue.   2. Initiating apnea therapy is recommended  given the presence of symptoms and/or associated conditions.  Recommend proceeding with one of the following:   a. Auto-CPAP therapy with a pressure range of 5-20cm H2O.   b. An oral appliance (OA) that can be obtained from certain dentists with expertise in sleep medicine. These are  primarily of use in non-obese patients with mild and moderate disease.   c. An ENT consultation which may be useful to look for specific causes of obstruction and possible treatment  options.   d. If patient is intolerant to PAP therapy, consider referral to ENT for evaluation for hypoglossal nerve stimulator.   3. Close follow-up is necessary to ensure success with CPAP or oral appliance therapy for maximum benefit .  4. A follow-up oximetry study on CPAP is recommended to assess the adequacy of therapy and determine the need  for supplemental oxygen or the potential need for Bi-level therapy. An arterial blood gas to determine the adequacy of  baseline ventilation and oxygenation should also be considered.  5. Healthy sleep recommendations include: adequate nightly sleep (normal 7-9 hrs/night), avoidance of caffeine after  noon and alcohol near bedtime, and maintaining a sleep environment that is cool, dark and quiet.  6. Weight loss for overweight patients is recommended. Even modest amounts of weight loss can significantly  improve the severity of sleep apnea.  7. Snoring recommendations include: weight loss where appropriate, side sleeping, and avoidance of alcohol before  bed.  8. Operation of motor vehicle should not be performed when sleepy.  Signature: [name], MD; [facility]; Diplomat, American Board of Sleep  Medicine Electronically Signed: 09/08/2023 10:13:49 A

## 2023-09-08 NOTE — Telephone Encounter (Signed)
-----   Message from [name] sent at 09/08/2023 10:15 AM EDT ----- Please let patient know that they have sleep apnea.  Recommend therapeutic CPAP titration for treatment of patient's sleep disordered breathing.

## 2023-09-08 NOTE — Telephone Encounter (Signed)
 Notified patient of sleep study results and recommendations. All question (if any) were answered. Patient verbalized understanding. CPAP Titration ordered today.

## 2023-09-30 ENCOUNTER — Telehealth: Payer: Self-pay | Admitting: Cardiology

## 2023-09-30 NOTE — Telephone Encounter (Signed)
 Patient would like to know if authorization has been submitted to her insurance for CPAP titration. If not, when should she expect this to be done? Please advise.

## 2023-10-01 NOTE — Telephone Encounter (Addendum)
 Prior Authorization is approved per [name], Authorization #[id number] for 10/14/23 - 12/13/23

## 2023-10-05 ENCOUNTER — Encounter: Payer: Self-pay | Admitting: Cardiology

## 2023-10-05 ENCOUNTER — Telehealth: Payer: Self-pay | Admitting: Cardiology

## 2023-10-05 NOTE — Telephone Encounter (Signed)
 [employer] calling along with pt on the line to check status of precert for testing scheduled 4/24. Pt requesting cb

## 2023-10-08 ENCOUNTER — Ambulatory Visit (HOSPITAL_COMMUNITY)
Admission: RE | Admit: 2023-10-08 | Discharge: 2023-10-08 | Disposition: A | Discharge status: Home / Self Care | Source: Ambulatory Visit | Attending: Cardiology | Admitting: Cardiology

## 2023-10-08 DIAGNOSIS — R918 Other nonspecific abnormal finding of lung field: Secondary | ICD-10-CM | POA: Diagnosis not present

## 2023-10-08 DIAGNOSIS — I700 Atherosclerosis of aorta: Secondary | ICD-10-CM | POA: Diagnosis not present

## 2023-10-08 DIAGNOSIS — Z0389 Encounter for observation for other suspected diseases and conditions ruled out: Secondary | ICD-10-CM | POA: Diagnosis not present

## 2023-10-08 DIAGNOSIS — I2699 Other pulmonary embolism without acute cor pulmonale: Secondary | ICD-10-CM | POA: Insufficient documentation

## 2023-10-08 MED ORDER — TECHNETIUM TO 99M ALBUMIN AGGREGATED
4.0300 | Freq: Once | INTRAVENOUS | Status: AC | PRN
  Administered 2023-10-08: 4.03 via INTRAVENOUS

## 2023-10-13 ENCOUNTER — Telehealth: Payer: Self-pay

## 2023-10-13 NOTE — Telephone Encounter (Signed)
 Spoke with patient and she states she is aware of Xray results.  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 She would like to know if the right hazy opacity is because she ws exposed to TB.  She states she is not symptomatic. She wanted to let you know that.

## 2023-10-13 NOTE — Telephone Encounter (Signed)
-----   Message from Gaylyn Keas sent at 10/12/2023  7:54 PM EDT ----- Chest x-ray was abnormal with a right hazy opacity in the right lower lobe on chest x-ray.  Please forward this to PCP to follow-up with repeat chest x-ray in 4 to 6 weeks as recommended by radiology

## 2023-10-13 NOTE — Telephone Encounter (Signed)
 Spoke with pt regarding Dr. Charl Concha suggestion. Pt was told it would be best to follow up with her PCP regarding her chest xray. Pt verbalized understanding. All questions if any were answered.

## 2023-10-14 ENCOUNTER — Telehealth: Payer: Self-pay

## 2023-10-14 DIAGNOSIS — R9389 Abnormal findings on diagnostic imaging of other specified body structures: Secondary | ICD-10-CM

## 2023-10-14 NOTE — Telephone Encounter (Signed)
 A chest xray was ordered for 4-6 weeks out. Pt notified. Pt verbalized understanding. All questions if any were answered.

## 2023-10-14 NOTE — Telephone Encounter (Signed)
-----   Message from Nurse Demetris S sent at 10/13/2023  4:19 PM EDT ----- Can you order chest xray ----- Message ----- From: Jacqueline Matsu, MD Sent: 10/12/2023   7:54 PM EDT To: [facility] Triage  Chest x-ray was abnormal with a right hazy opacity in the right lower lobe on chest x-ray.  Please forward this to PCP to follow-up with repeat chest x-ray in 4 to 6 weeks as recommended by radiology

## 2023-10-16 NOTE — Telephone Encounter (Signed)
 Spoke with pt regarding her home sleep study. Pt stated she completed the test on 3/18.

## 2023-10-29 ENCOUNTER — Ambulatory Visit (HOSPITAL_BASED_OUTPATIENT_CLINIC_OR_DEPARTMENT_OTHER): Attending: Cardiology | Admitting: Cardiology

## 2023-10-29 DIAGNOSIS — I50810 Right heart failure, unspecified: Secondary | ICD-10-CM

## 2023-10-29 DIAGNOSIS — R002 Palpitations: Secondary | ICD-10-CM

## 2023-10-29 DIAGNOSIS — I340 Nonrheumatic mitral (valve) insufficiency: Secondary | ICD-10-CM

## 2023-10-29 DIAGNOSIS — I071 Rheumatic tricuspid insufficiency: Secondary | ICD-10-CM

## 2023-10-29 DIAGNOSIS — G4733 Obstructive sleep apnea (adult) (pediatric): Secondary | ICD-10-CM

## 2023-10-29 DIAGNOSIS — I4510 Unspecified right bundle-branch block: Secondary | ICD-10-CM

## 2023-10-29 DIAGNOSIS — I4719 Other supraventricular tachycardia: Secondary | ICD-10-CM

## 2023-10-29 DIAGNOSIS — I2699 Other pulmonary embolism without acute cor pulmonale: Secondary | ICD-10-CM

## 2023-11-09 NOTE — Procedures (Signed)
   Maryan Smalling [facility] [address] [place], [place] [address] Tel: [phone]   Fax: [phone]  Titration Interpretation  Patient Name:  [name], [name] Date:  10/29/2023 Referring Physician:  Gaylyn Keas MD  Indications for Polysomnography The patient is a 78 year old Female who is 5\' 2"  and weighs 156.0 lbs. Her BMI equals 28.7.  A full night titration treatment study was performed.  Medication  none  No Data.   Polysomnogram Data A full night polysomnogram recorded the standard physiologic parameters including EEG, EOG, EMG, EKG, nasal and oral airflow.  Respiratory parameters of chest and abdominal movements were recorded with Respiratory Inductance Plethysmography belts.  Oxygen saturation was recorded by pulse oximetry.   Sleep Architecture The total recording time of the polysomnogram was 399.3 minutes.  The total sleep time was 194.0 minutes.  The patient spent 0.5% of total sleep time in Stage N1, 62.1% in Stage N2, 37.4% in Stages N3, and 0.0% in REM.  Sleep latency was 67.8 minutes.  REM latency was - minutes.  Sleep Efficiency was 48.6%.  Wake after Sleep Onset time was 137.0 minutes.  Titration Summary The patient was titrated at pressures ranging from 5 cm/H20 up to 13 cm/H20. The last pressure used in the study was 13 cm/H20.  Respiratory Events The polysomnogram revealed a presence of 0 obstructive, 12 centrals, and 0 mixed apneas resulting in an Apnea index of 12.4 events per hour.  There were 9 hypopneas (>=3% desaturation and/or arousal) resulting in an Apnea\Hypopnea Index (AHI >=3% desaturation and/or arousal) of 15.2 events per hour.  There were 5 hypopneas (>=4% desaturation) resulting in an Apnea\Hypopnea Index (AHI >=4% desaturation) of 13.9 events per hour.  There were 0 Respiratory Effort Related Arousals resulting in a RERA index of 0 events per hour. The Respiratory Disturbance Index is 15.2 events per hour.  The snore  index was 0 events per hour.  Mean oxygen saturation was 94.3%.  The lowest oxygen saturation during sleep was 85.0%.  Time spent <=88% oxygen saturation was 6.3 minutes (1.6%).  Limb Activity There were 5 limb movements recorded.  Of this total, 0 were classified as PLMs.  Of the PLMs, 0 were associated with arousals.  The Limb Movement index was 1.5 per hour while the PLM index was 0 per hour.  Cardiac Summary The average pulse rate was 61.1 bpm.  The minimum pulse rate was 54.0 bpm while the maximum pulse rate was 83.0 bpm.  Cardiac rhythm was normal.  Diagnosis:  Obstructive Sleep Apnea  Recommendations: Recommend a trial of ResMed CPAP at 13cm H2O with heated humidity and small Fisher[employer] Simplus Full Face mask. The patient should be counseled in good sleep hygiene.  The patient should be encouraged to avoid sleeping in the supine position. Encourage patient to avoid driving when sleepy. Followup with Sleep medicine clinic in 6 weeks.   This study was personally reviewed and electronically signed by: Gaylyn Keas MD Accredited Board Certified in Sleep Medicine Date/Time: 11/09/2023 3:09PM

## 2023-11-11 ENCOUNTER — Ambulatory Visit (HOSPITAL_COMMUNITY)
Admission: RE | Admit: 2023-11-11 | Discharge: 2023-11-11 | Disposition: A | Discharge status: Home / Self Care | Source: Ambulatory Visit | Attending: Cardiovascular Disease | Admitting: Cardiovascular Disease

## 2023-11-11 DIAGNOSIS — R918 Other nonspecific abnormal finding of lung field: Secondary | ICD-10-CM | POA: Diagnosis not present

## 2023-11-11 DIAGNOSIS — R9389 Abnormal findings on diagnostic imaging of other specified body structures: Secondary | ICD-10-CM | POA: Insufficient documentation

## 2023-11-11 DIAGNOSIS — I700 Atherosclerosis of aorta: Secondary | ICD-10-CM | POA: Diagnosis not present

## 2023-11-13 ENCOUNTER — Telehealth: Payer: Self-pay | Admitting: Cardiology

## 2023-11-13 NOTE — Telephone Encounter (Signed)
 Pt is requesting a callback regarding CPAP TITRATION results. Please advise

## 2023-11-16 ENCOUNTER — Telehealth: Payer: Self-pay | Admitting: *Deleted

## 2023-11-16 ENCOUNTER — Ambulatory Visit (HOSPITAL_COMMUNITY)
Admission: RE | Admit: 2023-11-16 | Discharge: 2023-11-16 | Disposition: A | Discharge status: Home / Self Care | Source: Ambulatory Visit | Attending: Cardiology | Admitting: Cardiology

## 2023-11-16 DIAGNOSIS — I4510 Unspecified right bundle-branch block: Secondary | ICD-10-CM | POA: Insufficient documentation

## 2023-11-16 DIAGNOSIS — I50810 Right heart failure, unspecified: Secondary | ICD-10-CM | POA: Insufficient documentation

## 2023-11-16 LAB — PULMONARY FUNCTION TEST
DL/VA % pred: 72 %
DL/VA: 3 ml/min/mmHg/L
DLCO unc % pred: 70 %
DLCO unc: 12.41 ml/min/mmHg
FEF 25-75 Post: 1.54 L/s
FEF 25-75 Pre: 2.44 L/s
FEF2575-%Change-Post: -36 %
FEF2575-%Pred-Post: 114 %
FEF2575-%Pred-Pre: 181 %
FEV1-%Change-Post: -23 %
FEV1-%Pred-Post: 107 %
FEV1-%Pred-Pre: 140 %
FEV1-Post: 1.92 L
FEV1-Pre: 2.51 L
FEV1FVC-%Change-Post: -23 %
FEV1FVC-%Pred-Pre: 113 %
FEV6-%Change-Post: -2 %
FEV6-%Pred-Post: 127 %
FEV6-%Pred-Pre: 131 %
FEV6-Post: 2.91 L
FEV6-Pre: 2.99 L
FEV6FVC-%Pred-Post: 105 %
FEV6FVC-%Pred-Pre: 105 %
FVC-%Change-Post: 0 %
FVC-%Pred-Post: 124 %
FVC-%Pred-Pre: 124 %
FVC-Post: 3 L
FVC-Pre: 3 L
Post FEV1/FVC ratio: 64 %
Post FEV6/FVC ratio: 100 %
Pre FEV1/FVC ratio: 84 %
Pre FEV6/FVC Ratio: 100 %
RV % pred: 72 %
RV: 1.64 L
TLC % pred: 98 %
TLC: 4.69 L

## 2023-11-16 MED ORDER — ALBUTEROL SULFATE (2.5 MG/3ML) 0.083% IN NEBU
2.5000 mg | INHALATION_SOLUTION | Freq: Once | RESPIRATORY_TRACT | Status: AC
  Administered 2023-11-16: 2.5 mg via RESPIRATORY_TRACT

## 2023-11-16 NOTE — Telephone Encounter (Signed)
The patient has been notified of the result and verbalized understanding.  All questions (if any) were answered.     

## 2023-11-16 NOTE — Telephone Encounter (Signed)
 The patient has been notified of the result and verbalized understanding.  All questions (if any) were answered. Gaylene Kays, CMA 11/16/2023 5:03 PM     Upon patient request DME selection is [facility]. Patient understands he will be contacted by [facility] to set up his cpap. Patient understands to call if [facility] does not contact him with new setup in a timely manner. Patient understands they will be called once confirmation has been received from [facility]/ that they have received their new machine to schedule 10 week follow up appointment.   [facility] notified of new cpap order  Please add to airview Patient was grateful for the call and thanked me.

## 2023-11-16 NOTE — Telephone Encounter (Signed)
-----   Message from Gaylyn Keas sent at 11/09/2023  3:10 PM EDT ----- Please let patient know that they had a successful PAP titration and let DME know that orders are in EPIC.  Please set up 6 week OV with me.

## 2023-11-19 ENCOUNTER — Ambulatory Visit: Payer: Self-pay | Admitting: Cardiology

## 2023-11-19 DIAGNOSIS — R9389 Abnormal findings on diagnostic imaging of other specified body structures: Secondary | ICD-10-CM

## 2023-11-19 NOTE — Telephone Encounter (Signed)
 Attempted to call patient, no VM designated on DPR. Sent [facility] message advising referral to pulmonology due to abnormal PFT and CXR, referral placed.

## 2023-11-19 NOTE — Telephone Encounter (Signed)
-----   Message from Gaylyn Keas sent at 11/19/2023  8:46 AM EDT ----- Breathing test show mild reduction in lung function.  With abnormal chest x-ray as well I would like her to be seen by pulmonary

## 2023-11-20 DIAGNOSIS — Z6827 Body mass index (BMI) 27.0-27.9, adult: Secondary | ICD-10-CM | POA: Diagnosis not present

## 2023-11-20 DIAGNOSIS — W57XXXA Bitten or stung by nonvenomous insect and other nonvenomous arthropods, initial encounter: Secondary | ICD-10-CM | POA: Diagnosis not present

## 2023-11-20 DIAGNOSIS — I50810 Right heart failure, unspecified: Secondary | ICD-10-CM | POA: Diagnosis not present

## 2023-11-20 DIAGNOSIS — L299 Pruritus, unspecified: Secondary | ICD-10-CM | POA: Diagnosis not present

## 2023-11-20 DIAGNOSIS — S90561A Insect bite (nonvenomous), right ankle, initial encounter: Secondary | ICD-10-CM | POA: Diagnosis not present

## 2023-11-22 ENCOUNTER — Encounter: Payer: Self-pay | Admitting: Cardiology

## 2023-12-11 DIAGNOSIS — G4733 Obstructive sleep apnea (adult) (pediatric): Secondary | ICD-10-CM | POA: Diagnosis not present

## 2024-01-10 DIAGNOSIS — G4733 Obstructive sleep apnea (adult) (pediatric): Secondary | ICD-10-CM | POA: Diagnosis not present

## 2024-01-16 NOTE — Progress Notes (Unsigned)
 [name], female    DOB: [birth year]   MRN: [id number]   Brief patient profile:  34  yowf never smoker retired [employer] fund[employer]  referred to pulmonary clinic 01/18/2024 by Nicole Bihari MD  for abnormal cxr s hx or prior resp dz or any resp symptoms       Pt not previously seen by PCCM service.     History of Present Illness  01/18/2024  Pulmonary/ 1st office eval/[name]  Chief Complaint  Patient presents with   Consult    Referred by Dr. Wilbert Rojas for eval of abnormal cxr.  She denies ane respiratory co's.    Dyspnea:  Not limited by breathing from desired activities   Cough: none  Sleep: bed is flat/ one pilllow cpap  SABA use: none  02 ldz:wnwz     No obvious day to day or daytime pattern/variability or assoc excess/ purulent sputum or mucus plugs or hemoptysis or cp or chest tightness, subjective wheeze or overt sinus or hb symptoms.    Also denies any obvious fluctuation of symptoms with weather or environmental changes or other aggravating or alleviating factors except as outlined above   No unusual exposure hx or h/o childhood pna/ asthma or knowledge of premature birth.  Current Allergies, Complete Past Medical History, Past Surgical History, Family History, and Social History were reviewed in [employer] record.  ROS  The following are not active complaints unless bolded Hoarseness, sore throat, dysphagia, dental problems, itching, sneezing,  nasal congestion or discharge of excess mucus or purulent secretions, ear ache,   fever, chills, sweats, unintended wt loss or wt gain, classically pleuritic or exertional cp,  orthopnea pnd or arm/hand swelling  or leg swelling, presyncope, palpitations, abdominal pain, anorexia, nausea, vomiting, diarrhea  or change in bowel habits or change in bladder habits, change in stools or change in urine, dysuria, hematuria,  rash, arthralgias, visual complaints, headache, numbness, weakness or ataxia or problems with walking or  coordination,  change in mood or  memory.             Outpatient Medications Prior to Visit  Medication Sig Dispense Refill   acetaminophen  (TYLENOL ) 500 MG tablet Take 1,000 mg by mouth every 6 (six) hours as needed for moderate pain.     ALPRAZolam (XANAX) 0.5 MG tablet Take 0.5 mg by mouth 3 (three) times daily as needed for anxiety.      Ascorbic Acid (VITAMIN C ) 1000 MG tablet Take 1,000 mg by mouth every evening.      aspirin  EC 81 MG tablet Take 81 mg by mouth every evening.      B Complex Vitamins (B COMPLEX-B12) TABS Take 1 tablet by mouth daily.     Biotin  (BIOTIN  5000) 5 MG CAPS Take 5 mg by mouth daily.     Calcium  Carbonate-Vit D-Min (CALCIUM  1200 PO) Take 1,200 mg by mouth every evening.     Cholecalciferol (VITAMIN D ) 2000 units CAPS Take 2,000 Units by mouth every evening.      Coenzyme Q10 (COQ10) 200 MG CAPS Take 200 mg by mouth daily.      Fish Oil-Krill Oil (KRILL OIL PLUS) CAPS Take 500 mg by mouth daily.      hyoscyamine (LEVSIN SL) 0.125 MG SL tablet Take 0.125 mg by mouth every 4 (four) hours as needed for pain.     loratadine (CLARITIN) 10 MG tablet Take 10 mg by mouth every evening.     MAGNESIUM  PO Take 500  mg by mouth every evening.      metoprolol  succinate (TOPROL  XL) 25 MG 24 hr tablet Take 1 tablet (25 mg total) by mouth daily. 90 tablet 3   Multiple Vitamin (MULTIVITAMIN WITH MINERALS) TABS tablet Take 1 tablet by mouth daily.     PARoxetine  (PAXIL ) 20 MG tablet Take 10 mg by mouth every morning.      Potassium 99 MG TABS Take 99 mg by mouth every evening.      simvastatin (ZOCOR) 10 MG tablet Take 10 mg by mouth every evening.      Vitamin A 2400 MCG (8000 UT) TABS Take 2,400 mcg by mouth daily.     Probiotic Product (PROBIOTIC DAILY PO) Take 1 capsule by mouth daily.     VITAMIN E PO Take 2,400 mcg by mouth daily.      No facility-administered medications prior to visit.    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     IBS (irritable bowel syndrome)    Other and unspecified hyperlipidemia    Palpitations    PAT (paroxysmal atrial tachycardia) (HCC)    Pre-diabetes    Vertigo       Objective:     BP (!) 153/80   Pulse 71   Temp 98 F (36.7 C) (Oral)   Ht 5' 2 (1.575 m)   Wt 149 lb 9.6 oz (67.9 kg)   SpO2 95%   BMI 27.36 kg/m   SpO2: 95 % RA    Pleasant amb wf nad    HEENT : Oropharynx  clear      Nasal turbinates nl    NECK :  without  apparent JVD/ palpable Nodes/TM    LUNGS: no acc muscle use,  Nl contour chest which is clear to A and P bilaterally without cough on insp or exp maneuvers   CV:  RRR  no s3 or murmur or increase in P2, and no edema   ABD:  soft and nontender   MS:  Gait nl   ext warm without deformities Or obvious joint restrictions  calf tenderness, cyanosis or clubbing    SKIN: warm and dry without lesions    NEURO:  alert, approp, nl sensorium with  no motor or cerebellar deficits apparent.        Assessment   Pulmonary infiltrates on CXR Never smoker, no symptoms or h/o pna - prior cxr = 03/08/16 ok  - CT s contrast 01/18/2024 >>>   Risk factors for lung ca are very low but she could still have a low grade asymptomatic adenoca or alveolar cell ca so best to put this concern aside with CT s contrast (I'm not really sure the lesion is in the lung as I don't see it on the lateral in the Ant basal segment where I should be)   Discussed in detail all the  indications, usual  risks and alternatives  relative to the benefits with patient who agrees to proceed with w/u as outlined.            Each maintenance medication was reviewed in detail including emphasizing most importantly the difference between maintenance and prns and under what circumstances the prns are to be triggered using an action plan format where  appropriate.  Total time for H and P, chart review, counseling, and generating  customized AVS unique to this office visit / same day charting = 32 min new pt eval       Nicole America, MD 01/18/2024

## 2024-01-18 ENCOUNTER — Ambulatory Visit: Admitting: Internal Medicine

## 2024-01-18 ENCOUNTER — Encounter: Payer: Self-pay | Admitting: Internal Medicine

## 2024-01-18 VITALS — BP 153/80 | HR 71 | Temp 98.0°F | Ht 62.0 in | Wt 149.6 lb

## 2024-01-18 DIAGNOSIS — R918 Other nonspecific abnormal finding of lung field: Secondary | ICD-10-CM | POA: Insufficient documentation

## 2024-01-18 NOTE — Patient Instructions (Signed)
 My office will be contacting you by phone for referral for CT without contrast   [phone])   - if you don't hear back from my office within one week please call us  back or notify us  thru MyChart and we'll address it right away.    Follow up here is as needed

## 2024-01-19 NOTE — Assessment & Plan Note (Signed)
 Never smoker, no symptoms or h/o pna - prior cxr = 03/08/16 ok  - CT s contrast 01/18/2024 >>>   Risk factors for lung ca are very low but she could still have a low grade asymptomatic adenoca or alveolar cell ca so best to put this concern aside with CT s contrast (I'm not really sure the lesion is in the lung as I don't see it on the lateral in the Ant basal segment where I should be)   Discussed in detail all the  indications, usual  risks and alternatives  relative to the benefits with patient who agrees to proceed with w/u as outlined.            Each maintenance medication was reviewed in detail including emphasizing most importantly the difference between maintenance and prns and under what circumstances the prns are to be triggered using an action plan format where appropriate.  Total time for H and P, chart review, counseling, and generating customized AVS unique to this office visit / same day charting = 32 min new pt eval

## 2024-01-26 DIAGNOSIS — L821 Other seborrheic keratosis: Secondary | ICD-10-CM | POA: Diagnosis not present

## 2024-01-26 DIAGNOSIS — Z411 Encounter for cosmetic surgery: Secondary | ICD-10-CM | POA: Diagnosis not present

## 2024-01-26 DIAGNOSIS — D229 Melanocytic nevi, unspecified: Secondary | ICD-10-CM | POA: Diagnosis not present

## 2024-01-26 DIAGNOSIS — L578 Other skin changes due to chronic exposure to nonionizing radiation: Secondary | ICD-10-CM | POA: Diagnosis not present

## 2024-01-26 DIAGNOSIS — Z86018 Personal history of other benign neoplasm: Secondary | ICD-10-CM | POA: Diagnosis not present

## 2024-01-26 DIAGNOSIS — L814 Other melanin hyperpigmentation: Secondary | ICD-10-CM | POA: Diagnosis not present

## 2024-02-01 DIAGNOSIS — Z6827 Body mass index (BMI) 27.0-27.9, adult: Secondary | ICD-10-CM | POA: Diagnosis not present

## 2024-02-01 DIAGNOSIS — Z01419 Encounter for gynecological examination (general) (routine) without abnormal findings: Secondary | ICD-10-CM | POA: Diagnosis not present

## 2024-02-01 DIAGNOSIS — Z1231 Encounter for screening mammogram for malignant neoplasm of breast: Secondary | ICD-10-CM | POA: Diagnosis not present

## 2024-02-02 ENCOUNTER — Ambulatory Visit (HOSPITAL_COMMUNITY)
Admission: RE | Admit: 2024-02-02 | Discharge: 2024-02-02 | Disposition: A | Discharge status: Home / Self Care | Source: Ambulatory Visit | Attending: Internal Medicine | Admitting: Internal Medicine

## 2024-02-02 DIAGNOSIS — J8410 Pulmonary fibrosis, unspecified: Secondary | ICD-10-CM | POA: Diagnosis not present

## 2024-02-02 DIAGNOSIS — R918 Other nonspecific abnormal finding of lung field: Secondary | ICD-10-CM | POA: Diagnosis not present

## 2024-02-07 NOTE — Progress Notes (Unsigned)
 Cardiology Note    Date:  02/08/2024   ID:  [name], DOB 03-06-1946, MRN [id number]  PCP:  Marvene Prentice SAUNDERS, FNP  Cardiologist:  Wilbert Bihari, MD   Chief Complaint  Patient presents with   Follow-up    Palpitations, right bundle branch block, RV dysfunction, tricuspid regurgitation, obstructive sleep apnea    History of Present Illness:  [name] is a 78 y.o. female with a history of hereditary thrombophilia, IBS, prediabetes, depression, vertigo and palpitations.  She was seen by cardiology back in 2017 for epigastric pain.  2D echo showed Normal LV function with EF 60 to 65% and grade 1 diastolic dysfunction.  Nuclear stress test showed no ischemia.  She does have a family history of her mother needing a pacemaker but no history of CAD.  She then complained of palpitations at one of her OVs.  Heart monitor showed normal sinus rhythm with average heart rate 83 bpm with episodes of nonsustained atrial tachycardia as long as 9 beats in a row and rare PACs atrial couplets and atrial triplets as well as rare PVCs.  She was started on Toprol -XL 25 mg daily.    She was seen back by Glendia Ferrier 04/04/2022 and palpitations had improved on low-dose Toprol .  She stated that time she did not like taking medications so her Toprol  was decreased to 12.5 mg daily with advisement to increase back to 25 mg daily if palpitations increased.    At last office visit she was found to have a new right bundle branch block and 2D echo 07/24/2023 showed EF 60 to 65% with normal global LV strain, moderate RV dysfunction, moderate RVE, mild MR, moderate to severe TR.  VQ scan 10/08/2023 showed no evidence of PE.  PFTs 11/16/2023 showed mild reduction in lung function.  She was seen by pulmonary and chest CT was ordered but results have not been reported.    She underwent a home sleep study which revealed moderate obstructive sleep apnea with an AHI of 26.8/h and 39.4/h during REM sleep.  O2 saturation nadir  was 80% and she did have nocturnal hypoxemia with 19.8 minutes of O2 saturations less than 88%.  She underwent CPAP titration on 10/29/2023 and was titrated to 13 cm of H2O.  She is now here for follow-up of her new diagnosis of obstructive sleep apnea and CPAP device.  She is doing well with her PAP device and thinks that she has gotten used to it.  She tolerates the full face mask and feels the pressure is adequate.  Since going on PAP she has no significant daytime sleepiness except when she sits down to read.  She denies any significant nasal dryness or nasal congestion.  She has some mouth dryness at times but has problems with the water chamber running out. She does not think that she snores. An Epworth Sleepiness Scale score was calculated the office today and this endorsed at 3 arguing against residual daytime sleepiness. Patient denies any episodes of bruxism, restless legs, No gagging hallucinations or cataplectic events.    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     IBS (irritable bowel syndrome)    Other and unspecified hyperlipidemia    Palpitations    PAT (paroxysmal atrial tachycardia) (HCC)    Pre-diabetes  Vertigo     Past Surgical History:  Procedure Laterality Date   ANTERIOR AND POSTERIOR REPAIR     CATARACT EXTRACTION  1998, 2003   bilateral   KNEE ARTHROSCOPY Right 01/06/2019   Procedure: RIGHT KNEE ARTHROSCOPY WITH DEBRIDEMENT AND PARTIAL LATERAL MENISCECTOMY;  Surgeon: Vernetta Lonni GRADE, MD;  Location: [facility] OR;  Service: Orthopedics;  Laterality: Right;   LAPAROSCOPIC CHOLECYSTECTOMY  2006   TONSILLECTOMY     and adenoids   TOTAL ABDOMINAL HYSTERECTOMY  1985   TUBAL LIGATION      Current Medications: Current Meds  Medication Sig   acetaminophen  (TYLENOL ) 500 MG tablet Take 1,000 mg by mouth  every 6 (six) hours as needed for moderate pain.   ALPRAZolam (XANAX) 0.5 MG tablet Take 0.5 mg by mouth 3 (three) times daily as needed for anxiety.    Ascorbic Acid (VITAMIN C ) 1000 MG tablet Take 1,000 mg by mouth every evening.    aspirin  EC 81 MG tablet Take 81 mg by mouth every evening.    B Complex Vitamins (B COMPLEX-B12) TABS Take 1 tablet by mouth daily.   Biotin  (BIOTIN  5000) 5 MG CAPS Take 5 mg by mouth daily.   Calcium  Carbonate-Vit D-Min (CALCIUM  1200 PO) Take 1,200 mg by mouth every evening.   Cholecalciferol (VITAMIN D ) 2000 units CAPS Take 2,000 Units by mouth every evening.    Coenzyme Q10 (COQ10) 200 MG CAPS Take 200 mg by mouth daily.    Fish Oil-Krill Oil (KRILL OIL PLUS) CAPS Take 500 mg by mouth daily.    hyoscyamine (LEVSIN SL) 0.125 MG SL tablet Take 0.125 mg by mouth every 4 (four) hours as needed for pain.   loratadine (CLARITIN) 10 MG tablet Take 10 mg by mouth every evening.   MAGNESIUM  PO Take 500 mg by mouth every evening.    metoprolol  succinate (TOPROL  XL) 25 MG 24 hr tablet Take 1 tablet (25 mg total) by mouth daily.   Multiple Vitamin (MULTIVITAMIN WITH MINERALS) TABS tablet Take 1 tablet by mouth daily.   PARoxetine  (PAXIL ) 20 MG tablet Take 10 mg by mouth every morning.    Potassium 99 MG TABS Take 99 mg by mouth every evening.    simvastatin (ZOCOR) 10 MG tablet Take 10 mg by mouth every evening.    Vitamin A 2400 MCG (8000 UT) TABS Take 2,400 mcg by mouth daily.    Allergies:   Codeine, Demerol [meperidine], and Robitussin dm [dextromethorphan-guaifenesin]   Social History   Socioeconomic History   Marital status: Widowed    Spouse name: Not on file   Number of children: Not on file   Years of education: Not on file   Highest education level: Not on file  Occupational History   Not on file  Tobacco Use   Smoking status: Never    Passive exposure: Past   Smokeless tobacco: Never  Vaping Use   Vaping status: Never Used  Substance and Sexual  Activity   Alcohol use: No   Drug use: No   Sexual activity: Not on file  Other Topics Concern   Not on file  Social History Narrative   Not on file   Social Drivers of Health   Financial Resource Strain: Not on file  Food Insecurity: Not on file  Transportation Needs: Not on file  Physical Activity: Not on file  Stress: Not on file  Social Connections: Unknown (10/24/2021)   [facility]   Social Network    Social  Network: Not on file     Family History:  The patient's family history includes Diabetes in her mother; Heart disease in her mother; Hypertension in her father and sister; Prostate cancer in her father; Uterine cancer in her mother.   ROS:   Please see the history of present illness.    ROS All other systems reviewed and are negative.      No data to display             PHYSICAL EXAM:   VS:  BP 136/74   Pulse 64   Ht 5' 2 (1.575 m)   Wt 148 lb (67.1 kg)   SpO2 99%   BMI 27.07 kg/m    GEN: Well nourished, well developed in no acute distress HEENT: Normal NECK: No JVD; No carotid bruits LYMPHATICS: No lymphadenopathy CARDIAC:RRR, no murmurs, rubs, gallops RESPIRATORY:  Clear to auscultation without rales, wheezing or rhonchi  ABDOMEN: Soft, non-tender, non-distended MUSCULOSKELETAL:  No edema; No deformity  SKIN: Warm and dry NEUROLOGIC:  Alert and oriented x 3 PSYCHIATRIC:  Normal affect  Wt Readings from Last 3 Encounters:  02/08/24 148 lb (67.1 kg)  01/18/24 149 lb 9.6 oz (67.9 kg)  10/30/23 156 lb (70.8 kg)      Studies/Labs Reviewed:   Home sleep study, CPAP titration, PAP compliance download Recent Labs: No results found for requested labs within last 365 days.   Lipid Panel No results found for: CHOL, TRIG, HDL, CHOLHDL, VLDL, LDLCALC, LDLDIRECT   ASSESSMENT:    1. Palpitations   2. PAT (paroxysmal atrial tachycardia) (HCC)   3. PVC (premature ventricular contraction)   4. RBBB (right bundle  branch block)   5. Tricuspid valve insufficiency, unspecified etiology   6. Right ventricular failure (HCC)   7. OSA (obstructive sleep apnea)   8. Nocturnal hypoxemia       PLAN:  In order of problems listed above:  Palpitations PAT PVCs - 2-week Zio patch showed several episodes of nonsustained atrial tachycardia up to 9 beats in a row with rare PACs, atrial couplets and rare PVCs - She was placed on Toprol  XL 25 mg daily  - She has not had any further significant palpitations on beta-blocker therapy - Continue Toprol  XL 25 mg daily with as needed refills  RBBB - 2D echo 07/24/2023 showed EF 60 to 65% with moderate RV dysfunction and moderate RVE as well as mild MR and moderate to severe TR. - Stress Myoview  showed no ischemia and normal LV function  RV dysfunction/RV enlargement/tricuspid valve regurgitation - Moderate RV dysfunction and moderate RVE with moderate to severe TR noted on 2D echo 07/2023 - Possibly related to underlying sleep apnea with nocturnal hypoxemia - PFTs showed mild reduction in airflow>> currently seeing pulmonary for workup - Chest CT results pending that pulmonary ordered - May be related to underlying nocturnal hypoxemia from recently diagnosed sleep apnea - Will check an overnight pulse ox on CPAP to make sure there is no residual hypoxemia  OSA/nocturnal hypoxemia- The patient is tolerating PAP therapy well without any problems. The PAP download performed by his DME was personally reviewed and interpreted by me today and showed an AHI of 8.5 /hr on 13 cm H2O with 93% compliance in using more than 4 hours nightly.  The patient has been using and benefiting from PAP use and will continue to benefit from therapy.  - She appears to have a very large mask leak which is likely the etiology of her elevated  AHI - she has not been changing out her cushion so I will place an order for PAP supplies and instructed her to change out the cushion every 2-4  weeks. -Repeat download in 4 weeks  Followup with me in 1 year   Time Spent: 20 minutes total time of encounter, including 15 minutes spent in face-to-face patient care on the date of this encounter. This time includes coordination of care and counseling regarding above mentioned problem list. Remainder of non-face-to-face time involved reviewing chart documents/testing relevant to the patient encounter and documentation in the medical record. I have independently reviewed documentation from referring provider  Medication Adjustments/Labs and Tests Ordered: Current medicines are reviewed at length with the patient today.  Concerns regarding medicines are outlined above.  Medication changes, Labs and Tests ordered today are listed in the Patient Instructions below.  There are no Patient Instructions on file for this visit.   Signed, Wilbert Bihari, MD  02/08/2024 9:32 AM    [facility] [address] [place], [place], [place]  [address] Phone: [phone]; Fax: [phone]

## 2024-02-08 ENCOUNTER — Ambulatory Visit: Attending: Cardiology | Admitting: Cardiology

## 2024-02-08 ENCOUNTER — Telehealth: Payer: Self-pay | Admitting: *Deleted

## 2024-02-08 ENCOUNTER — Encounter: Payer: Self-pay | Admitting: Cardiology

## 2024-02-08 VITALS — BP 136/74 | HR 64 | Ht 62.0 in | Wt 148.0 lb

## 2024-02-08 DIAGNOSIS — I4719 Other supraventricular tachycardia: Secondary | ICD-10-CM

## 2024-02-08 DIAGNOSIS — R002 Palpitations: Secondary | ICD-10-CM

## 2024-02-08 DIAGNOSIS — I4510 Unspecified right bundle-branch block: Secondary | ICD-10-CM | POA: Diagnosis not present

## 2024-02-08 DIAGNOSIS — I50810 Right heart failure, unspecified: Secondary | ICD-10-CM

## 2024-02-08 DIAGNOSIS — I071 Rheumatic tricuspid insufficiency: Secondary | ICD-10-CM

## 2024-02-08 DIAGNOSIS — G4734 Idiopathic sleep related nonobstructive alveolar hypoventilation: Secondary | ICD-10-CM

## 2024-02-08 DIAGNOSIS — I493 Ventricular premature depolarization: Secondary | ICD-10-CM | POA: Diagnosis not present

## 2024-02-08 DIAGNOSIS — G4733 Obstructive sleep apnea (adult) (pediatric): Secondary | ICD-10-CM | POA: Diagnosis not present

## 2024-02-08 NOTE — Telephone Encounter (Signed)
-----   Message from Wilbert Bihari sent at 02/08/2024  9:30 AM EDT ----- Check download in 4 weeks

## 2024-02-08 NOTE — Telephone Encounter (Signed)
-----   Message from Wilbert Bihari sent at 02/08/2024  9:27 AM EDT ----- Sorry disregard the echo order.  Please get an ONO  on CPAP

## 2024-02-08 NOTE — Patient Instructions (Signed)
 Medication Instructions:  Your physician recommends that you continue on your current medications as directed. Please refer to the Current Medication list given to you today.   *If you need a refill on your cardiac medications before your next appointment, please call your pharmacy*  Lab Work: None.  If you have labs (blood work) drawn today and your tests are completely normal, you will receive your results only by: MyChart Message (if you have MyChart) OR A paper copy in the mail If you have any lab test that is abnormal or we need to change your treatment, we will call you to review the results.  Testing/Procedures: None.  Follow-Up: At [facility], you and your health needs are our priority.  As part of our continuing mission to provide you with exceptional heart care, our providers are all part of one team.  This team includes your primary Cardiologist (physician) and Advanced Practice Providers or APPs (Physician Assistants and Nurse Practitioners) who all work together to provide you with the care you need, when you need it.  Your next appointment:   1 year(s)  Provider:   Wilbert Bihari, MD    We recommend signing up for the patient portal called MyChart.  Sign up information is provided on this After Visit Summary.  MyChart is used to connect with patients for Virtual Visits (Telemedicine).  Patients are able to view lab/test results, encounter notes, upcoming appointments, etc.  Non-urgent messages can be sent to your provider as well.   To learn more about what you can do with MyChart, go to [email or web address].   Other Instructions Someone from your DME company will contact you regarding CPAP supplies as well as about setting up an overnight oxygen study.

## 2024-02-08 NOTE — Telephone Encounter (Signed)
 Per Dr Shlomo order new supplies.  Supplies order placed to [employer] via community message.

## 2024-02-10 DIAGNOSIS — G4733 Obstructive sleep apnea (adult) (pediatric): Secondary | ICD-10-CM | POA: Diagnosis not present

## 2024-02-15 ENCOUNTER — Ambulatory Visit: Payer: Self-pay | Admitting: Internal Medicine

## 2024-02-16 ENCOUNTER — Encounter: Payer: Self-pay | Admitting: *Deleted

## 2024-02-17 ENCOUNTER — Other Ambulatory Visit: Payer: Self-pay | Admitting: Internal Medicine

## 2024-02-17 DIAGNOSIS — R918 Other nonspecific abnormal finding of lung field: Secondary | ICD-10-CM

## 2024-02-17 NOTE — Progress Notes (Signed)
 Spoke with pt and notified of results per Dr. Darlean. Pt verbalized understanding and denied any questions. She was agreeable to scan in 6 months and order was placed for this.

## 2024-02-26 DIAGNOSIS — Z0389 Encounter for observation for other suspected diseases and conditions ruled out: Secondary | ICD-10-CM | POA: Diagnosis not present

## 2024-03-04 DIAGNOSIS — R059 Cough, unspecified: Secondary | ICD-10-CM | POA: Diagnosis not present

## 2024-03-04 DIAGNOSIS — U071 COVID-19: Secondary | ICD-10-CM | POA: Diagnosis not present

## 2024-03-12 DIAGNOSIS — G4733 Obstructive sleep apnea (adult) (pediatric): Secondary | ICD-10-CM | POA: Diagnosis not present

## 2024-04-18 ENCOUNTER — Encounter: Payer: Self-pay | Admitting: Radiology

## 2024-06-16 ENCOUNTER — Other Ambulatory Visit: Payer: Self-pay | Admitting: Cardiology

## 2024-07-25 ENCOUNTER — Ambulatory Visit (HOSPITAL_COMMUNITY)
